# Patient Record
Sex: Female | Born: 1980 | Race: White | Hispanic: No | Marital: Married | Smoking: Former smoker
Health system: Southern US, Community
[De-identification: ages and names within clinical notes are randomized; demographics above are authoritative.]

## PROBLEM LIST (undated history)

## (undated) DIAGNOSIS — G4700 Insomnia, unspecified: Secondary | ICD-10-CM

## (undated) DIAGNOSIS — R799 Abnormal finding of blood chemistry, unspecified: Secondary | ICD-10-CM

## (undated) DIAGNOSIS — L309 Dermatitis, unspecified: Secondary | ICD-10-CM

## (undated) DIAGNOSIS — Z148 Genetic carrier of other disease: Secondary | ICD-10-CM

## (undated) DIAGNOSIS — T8859XA Other complications of anesthesia, initial encounter: Secondary | ICD-10-CM

## (undated) DIAGNOSIS — F319 Bipolar disorder, unspecified: Secondary | ICD-10-CM

## (undated) DIAGNOSIS — R011 Cardiac murmur, unspecified: Secondary | ICD-10-CM

## (undated) DIAGNOSIS — K589 Irritable bowel syndrome without diarrhea: Secondary | ICD-10-CM

## (undated) DIAGNOSIS — G43909 Migraine, unspecified, not intractable, without status migrainosus: Secondary | ICD-10-CM

## (undated) DIAGNOSIS — M797 Fibromyalgia: Secondary | ICD-10-CM

## (undated) DIAGNOSIS — T4145XA Adverse effect of unspecified anesthetic, initial encounter: Secondary | ICD-10-CM

## (undated) DIAGNOSIS — S92909K Unspecified fracture of unspecified foot, subsequent encounter for fracture with nonunion: Secondary | ICD-10-CM

## (undated) HISTORY — PX: LAPAROSCOPIC ENDOMETRIOSIS FULGURATION: SUR769

## (undated) HISTORY — PX: OOPHORECTOMY: SHX86

## (undated) HISTORY — PX: RECTOCELE REPAIR: SHX761

## (undated) HISTORY — PX: BLADDER SUSPENSION: SHX72

## (undated) HISTORY — PX: CYSTOCELE REPAIR: SHX163

## (undated) HISTORY — PX: BILATERAL SALPINGECTOMY: SHX5743

## (undated) HISTORY — PX: ABDOMINAL HYSTERECTOMY: SHX81

## (undated) HISTORY — PX: CHOLECYSTECTOMY: SHX55

## (undated) HISTORY — PX: COLONOSCOPY: SHX174

---

## 1999-01-12 ENCOUNTER — Other Ambulatory Visit: Admission: RE | Admit: 1999-01-12 | Discharge: 1999-01-12 | Payer: Self-pay | Admitting: Obstetrics and Gynecology

## 1999-11-19 ENCOUNTER — Emergency Department (HOSPITAL_COMMUNITY): Admission: EM | Admit: 1999-11-19 | Discharge: 1999-11-19 | Payer: Self-pay | Admitting: Emergency Medicine

## 2000-06-01 ENCOUNTER — Ambulatory Visit (HOSPITAL_COMMUNITY): Admission: RE | Admit: 2000-06-01 | Discharge: 2000-06-01 | Payer: Self-pay | Admitting: Obstetrics and Gynecology

## 2001-04-02 ENCOUNTER — Other Ambulatory Visit: Admission: RE | Admit: 2001-04-02 | Discharge: 2001-04-02 | Payer: Self-pay | Admitting: Obstetrics and Gynecology

## 2001-06-05 ENCOUNTER — Ambulatory Visit (HOSPITAL_COMMUNITY): Admission: RE | Admit: 2001-06-05 | Discharge: 2001-06-05 | Payer: Self-pay | Admitting: Obstetrics and Gynecology

## 2002-05-20 ENCOUNTER — Inpatient Hospital Stay (HOSPITAL_COMMUNITY): Admission: AD | Admit: 2002-05-20 | Discharge: 2002-05-20 | Payer: Self-pay | Admitting: Obstetrics and Gynecology

## 2002-05-21 ENCOUNTER — Encounter: Payer: Self-pay | Admitting: Obstetrics and Gynecology

## 2002-06-09 ENCOUNTER — Ambulatory Visit (HOSPITAL_COMMUNITY): Admission: RE | Admit: 2002-06-09 | Discharge: 2002-06-09 | Payer: Self-pay | Admitting: Obstetrics and Gynecology

## 2002-07-29 ENCOUNTER — Other Ambulatory Visit: Admission: RE | Admit: 2002-07-29 | Discharge: 2002-07-29 | Payer: Self-pay | Admitting: Obstetrics and Gynecology

## 2015-11-30 ENCOUNTER — Other Ambulatory Visit: Payer: Self-pay | Admitting: Sports Medicine

## 2015-11-30 DIAGNOSIS — M25571 Pain in right ankle and joints of right foot: Secondary | ICD-10-CM

## 2015-12-15 ENCOUNTER — Ambulatory Visit
Admission: RE | Admit: 2015-12-15 | Discharge: 2015-12-15 | Disposition: A | Discharge status: Home / Self Care | Payer: Self-pay | Source: Ambulatory Visit | Attending: Sports Medicine | Admitting: Sports Medicine

## 2015-12-15 DIAGNOSIS — M25571 Pain in right ankle and joints of right foot: Secondary | ICD-10-CM

## 2015-12-23 ENCOUNTER — Other Ambulatory Visit: Payer: Self-pay | Admitting: Orthopedic Surgery

## 2015-12-28 DIAGNOSIS — S92909K Unspecified fracture of unspecified foot, subsequent encounter for fracture with nonunion: Secondary | ICD-10-CM

## 2015-12-28 HISTORY — DX: Unspecified fracture of unspecified foot, subsequent encounter for fracture with nonunion: S92.909K

## 2015-12-30 ENCOUNTER — Encounter (HOSPITAL_BASED_OUTPATIENT_CLINIC_OR_DEPARTMENT_OTHER): Payer: Self-pay | Admitting: *Deleted

## 2016-01-05 ENCOUNTER — Ambulatory Visit (HOSPITAL_BASED_OUTPATIENT_CLINIC_OR_DEPARTMENT_OTHER): Payer: No Typology Code available for payment source | Admitting: Anesthesiology

## 2016-01-05 ENCOUNTER — Encounter (HOSPITAL_BASED_OUTPATIENT_CLINIC_OR_DEPARTMENT_OTHER): Payer: Self-pay | Admitting: Certified Registered"

## 2016-01-05 ENCOUNTER — Ambulatory Visit (HOSPITAL_BASED_OUTPATIENT_CLINIC_OR_DEPARTMENT_OTHER)
Admission: RE | Admit: 2016-01-05 | Discharge: 2016-01-05 | Disposition: A | Discharge status: Home / Self Care | Payer: No Typology Code available for payment source | Source: Ambulatory Visit | Attending: Orthopedic Surgery | Admitting: Orthopedic Surgery

## 2016-01-05 ENCOUNTER — Encounter (HOSPITAL_BASED_OUTPATIENT_CLINIC_OR_DEPARTMENT_OTHER)
Admission: RE | Disposition: A | Discharge status: Home / Self Care | Payer: Self-pay | Source: Ambulatory Visit | Attending: Orthopedic Surgery

## 2016-01-05 DIAGNOSIS — E669 Obesity, unspecified: Secondary | ICD-10-CM | POA: Diagnosis not present

## 2016-01-05 DIAGNOSIS — F319 Bipolar disorder, unspecified: Secondary | ICD-10-CM | POA: Insufficient documentation

## 2016-01-05 DIAGNOSIS — Z9049 Acquired absence of other specified parts of digestive tract: Secondary | ICD-10-CM | POA: Insufficient documentation

## 2016-01-05 DIAGNOSIS — Z79899 Other long term (current) drug therapy: Secondary | ICD-10-CM | POA: Insufficient documentation

## 2016-01-05 DIAGNOSIS — X58XXXA Exposure to other specified factors, initial encounter: Secondary | ICD-10-CM | POA: Diagnosis not present

## 2016-01-05 DIAGNOSIS — Z6834 Body mass index (BMI) 34.0-34.9, adult: Secondary | ICD-10-CM | POA: Diagnosis not present

## 2016-01-05 DIAGNOSIS — K589 Irritable bowel syndrome without diarrhea: Secondary | ICD-10-CM | POA: Insufficient documentation

## 2016-01-05 DIAGNOSIS — S92351A Displaced fracture of fifth metatarsal bone, right foot, initial encounter for closed fracture: Secondary | ICD-10-CM | POA: Insufficient documentation

## 2016-01-05 HISTORY — DX: Dermatitis, unspecified: L30.9

## 2016-01-05 HISTORY — DX: Unspecified fracture of unspecified foot, subsequent encounter for fracture with nonunion: S92.909K

## 2016-01-05 HISTORY — DX: Cardiac murmur, unspecified: R01.1

## 2016-01-05 HISTORY — DX: Other complications of anesthesia, initial encounter: T88.59XA

## 2016-01-05 HISTORY — DX: Genetic carrier of other disease: Z14.8

## 2016-01-05 HISTORY — DX: Migraine, unspecified, not intractable, without status migrainosus: G43.909

## 2016-01-05 HISTORY — PX: TENDON REPAIR: SHX5111

## 2016-01-05 HISTORY — DX: Irritable bowel syndrome, unspecified: K58.9

## 2016-01-05 HISTORY — DX: Insomnia, unspecified: G47.00

## 2016-01-05 HISTORY — DX: Abnormal finding of blood chemistry, unspecified: R79.9

## 2016-01-05 HISTORY — DX: Bipolar disorder, unspecified: F31.9

## 2016-01-05 HISTORY — DX: Fibromyalgia: M79.7

## 2016-01-05 HISTORY — DX: Adverse effect of unspecified anesthetic, initial encounter: T41.45XA

## 2016-01-05 SURGERY — TENDON REPAIR
Anesthesia: Regional | Site: Foot | Laterality: Right

## 2016-01-05 MED ORDER — PROPOFOL 10 MG/ML IV BOLUS
INTRAVENOUS | Status: AC
  Filled 2016-01-05: qty 20

## 2016-01-05 MED ORDER — LIDOCAINE 2% (20 MG/ML) 5 ML SYRINGE
INTRAMUSCULAR | Status: AC
  Filled 2016-01-05: qty 5

## 2016-01-05 MED ORDER — SENNA 8.6 MG PO TABS: 2.0000 | ORAL_TABLET | Freq: Two times a day (BID) | ORAL | 0 refills | Status: DC

## 2016-01-05 MED ORDER — 0.9 % SODIUM CHLORIDE (POUR BTL) OPTIME
TOPICAL | Status: DC | PRN
  Administered 2016-01-05: 200 mL

## 2016-01-05 MED ORDER — KETOROLAC TROMETHAMINE 30 MG/ML IJ SOLN: 30.0000 mg | Freq: Once | INTRAMUSCULAR | Status: DC | PRN

## 2016-01-05 MED ORDER — MIDAZOLAM HCL 2 MG/2ML IJ SOLN
1.0000 mg | INTRAMUSCULAR | Status: DC | PRN
  Administered 2016-01-05: 2 mg via INTRAVENOUS
  Administered 2016-01-05: 1 mg via INTRAVENOUS

## 2016-01-05 MED ORDER — DEXAMETHASONE SODIUM PHOSPHATE 10 MG/ML IJ SOLN
INTRAMUSCULAR | Status: AC
  Filled 2016-01-05: qty 1

## 2016-01-05 MED ORDER — ONDANSETRON HCL 4 MG/2ML IJ SOLN
INTRAMUSCULAR | Status: DC | PRN
  Administered 2016-01-05: 4 mg via INTRAVENOUS

## 2016-01-05 MED ORDER — OXYCODONE HCL 5 MG PO TABS: 5.0000 mg | ORAL_TABLET | ORAL | 0 refills | Status: DC | PRN

## 2016-01-05 MED ORDER — FENTANYL CITRATE (PF) 100 MCG/2ML IJ SOLN
25.0000 ug | INTRAMUSCULAR | Status: DC | PRN
  Administered 2016-01-05 (×3): 50 ug via INTRAVENOUS

## 2016-01-05 MED ORDER — SODIUM CHLORIDE 0.9 % IV SOLN: INTRAVENOUS | Status: DC

## 2016-01-05 MED ORDER — FENTANYL CITRATE (PF) 100 MCG/2ML IJ SOLN
INTRAMUSCULAR | Status: AC
  Filled 2016-01-05: qty 2

## 2016-01-05 MED ORDER — CHLORHEXIDINE GLUCONATE 4 % EX LIQD: 60.0000 mL | Freq: Once | CUTANEOUS | Status: DC

## 2016-01-05 MED ORDER — MEPERIDINE HCL 25 MG/ML IJ SOLN: 6.2500 mg | INTRAMUSCULAR | Status: DC | PRN

## 2016-01-05 MED ORDER — OXYCODONE HCL 5 MG PO TABS
5.0000 mg | ORAL_TABLET | Freq: Once | ORAL | Status: AC
  Administered 2016-01-05: 5 mg via ORAL

## 2016-01-05 MED ORDER — FENTANYL CITRATE (PF) 100 MCG/2ML IJ SOLN
50.0000 ug | INTRAMUSCULAR | Status: DC | PRN
  Administered 2016-01-05: 100 ug via INTRAVENOUS

## 2016-01-05 MED ORDER — MIDAZOLAM HCL 2 MG/2ML IJ SOLN
INTRAMUSCULAR | Status: AC
  Filled 2016-01-05: qty 2

## 2016-01-05 MED ORDER — CEFAZOLIN SODIUM-DEXTROSE 2-4 GM/100ML-% IV SOLN
2.0000 g | INTRAVENOUS | Status: AC
  Administered 2016-01-05: 2 g via INTRAVENOUS

## 2016-01-05 MED ORDER — SCOPOLAMINE 1 MG/3DAYS TD PT72: 1.0000 | MEDICATED_PATCH | Freq: Once | TRANSDERMAL | Status: DC | PRN

## 2016-01-05 MED ORDER — DEXAMETHASONE SODIUM PHOSPHATE 10 MG/ML IJ SOLN
INTRAMUSCULAR | Status: DC | PRN
  Administered 2016-01-05: 10 mg via INTRAVENOUS

## 2016-01-05 MED ORDER — LIDOCAINE HCL (CARDIAC) 20 MG/ML IV SOLN
INTRAVENOUS | Status: DC | PRN
  Administered 2016-01-05: 30 mg via INTRAVENOUS

## 2016-01-05 MED ORDER — ASPIRIN EC 81 MG PO TBEC: 81.0000 mg | DELAYED_RELEASE_TABLET | Freq: Two times a day (BID) | ORAL | 0 refills | Status: DC

## 2016-01-05 MED ORDER — BUPIVACAINE-EPINEPHRINE (PF) 0.5% -1:200000 IJ SOLN
INTRAMUSCULAR | Status: DC | PRN
  Administered 2016-01-05: 30 mL via PERINEURAL

## 2016-01-05 MED ORDER — PROMETHAZINE HCL 25 MG/ML IJ SOLN: 6.2500 mg | INTRAMUSCULAR | Status: DC | PRN

## 2016-01-05 MED ORDER — ONDANSETRON HCL 4 MG PO TABS: 4.0000 mg | ORAL_TABLET | Freq: Every day | ORAL | 1 refills | Status: DC | PRN

## 2016-01-05 MED ORDER — LACTATED RINGERS IV SOLN
INTRAVENOUS | Status: DC
  Administered 2016-01-05: 10 mL/h via INTRAVENOUS

## 2016-01-05 MED ORDER — OXYCODONE HCL 5 MG PO TABS
ORAL_TABLET | ORAL | Status: AC
  Filled 2016-01-05: qty 1

## 2016-01-05 MED ORDER — PROPOFOL 10 MG/ML IV BOLUS
INTRAVENOUS | Status: DC | PRN
  Administered 2016-01-05: 200 mg via INTRAVENOUS

## 2016-01-05 MED ORDER — ONDANSETRON HCL 4 MG/2ML IJ SOLN
INTRAMUSCULAR | Status: AC
  Filled 2016-01-05: qty 2

## 2016-01-05 SURGICAL SUPPLY — 72 items
ANCHOR SUT 1.45 SZ 1 SHORT (Anchor) ×3 IMPLANT
BANDAGE ESMARK 6X9 LF (GAUZE/BANDAGES/DRESSINGS) ×2 IMPLANT
BLADE AVERAGE 25X9 (BLADE) IMPLANT
BLADE SURG 15 STRL LF DISP TIS (BLADE) ×4 IMPLANT
BLADE SURG 15 STRL SS (BLADE) ×2
BNDG COHESIVE 4X5 TAN STRL (GAUZE/BANDAGES/DRESSINGS) ×3 IMPLANT
BNDG COHESIVE 6X5 TAN STRL LF (GAUZE/BANDAGES/DRESSINGS) ×3 IMPLANT
BNDG ESMARK 4X9 LF (GAUZE/BANDAGES/DRESSINGS) IMPLANT
BNDG ESMARK 6X9 LF (GAUZE/BANDAGES/DRESSINGS) ×3
CHLORAPREP W/TINT 26ML (MISCELLANEOUS) ×3 IMPLANT
COVER BACK TABLE 60X90IN (DRAPES) ×3 IMPLANT
CUFF TOURNIQUET SINGLE 34IN LL (TOURNIQUET CUFF) ×3 IMPLANT
DECANTER SPIKE VIAL GLASS SM (MISCELLANEOUS) IMPLANT
DRAPE EXTREMITY T 121X128X90 (DRAPE) ×3 IMPLANT
DRAPE OEC MINIVIEW 54X84 (DRAPES) ×3 IMPLANT
DRAPE U-SHAPE 47X51 STRL (DRAPES) ×3 IMPLANT
DRSG MEPITEL 4X7.2 (GAUZE/BANDAGES/DRESSINGS) ×3 IMPLANT
DRSG PAD ABDOMINAL 8X10 ST (GAUZE/BANDAGES/DRESSINGS) ×6 IMPLANT
ELECT REM PT RETURN 9FT ADLT (ELECTROSURGICAL) ×3
ELECTRODE REM PT RTRN 9FT ADLT (ELECTROSURGICAL) ×2 IMPLANT
GAUZE SPONGE 4X4 12PLY STRL (GAUZE/BANDAGES/DRESSINGS) ×3 IMPLANT
GLOVE BIO SURGEON STRL SZ8 (GLOVE) ×3 IMPLANT
GLOVE BIOGEL PI IND STRL 7.0 (GLOVE) ×4 IMPLANT
GLOVE BIOGEL PI IND STRL 8 (GLOVE) ×4 IMPLANT
GLOVE BIOGEL PI INDICATOR 7.0 (GLOVE) ×2
GLOVE BIOGEL PI INDICATOR 8 (GLOVE) ×2
GLOVE ECLIPSE 6.5 STRL STRAW (GLOVE) ×3 IMPLANT
GLOVE ECLIPSE 7.5 STRL STRAW (GLOVE) ×3 IMPLANT
GLOVE EXAM NITRILE MD LF STRL (GLOVE) IMPLANT
GOWN STRL REUS W/ TWL LRG LVL3 (GOWN DISPOSABLE) ×2 IMPLANT
GOWN STRL REUS W/ TWL XL LVL3 (GOWN DISPOSABLE) ×4 IMPLANT
GOWN STRL REUS W/TWL LRG LVL3 (GOWN DISPOSABLE) ×1
GOWN STRL REUS W/TWL XL LVL3 (GOWN DISPOSABLE) ×2
K-WIRE .062X4 (WIRE) IMPLANT
NDL SUT 6 .5 CRC .975X.05 MAYO (NEEDLE) IMPLANT
NEEDLE HYPO 22GX1.5 SAFETY (NEEDLE) IMPLANT
NEEDLE HYPO 25X1 1.5 SAFETY (NEEDLE) IMPLANT
NEEDLE MAYO TAPER (NEEDLE)
NS IRRIG 1000ML POUR BTL (IV SOLUTION) ×3 IMPLANT
PACK BASIN DAY SURGERY FS (CUSTOM PROCEDURE TRAY) ×3 IMPLANT
PAD CAST 4YDX4 CTTN HI CHSV (CAST SUPPLIES) ×2 IMPLANT
PADDING CAST ABS 4INX4YD NS (CAST SUPPLIES)
PADDING CAST ABS COTTON 4X4 ST (CAST SUPPLIES) IMPLANT
PADDING CAST COTTON 4X4 STRL (CAST SUPPLIES) ×1
PADDING CAST COTTON 6X4 STRL (CAST SUPPLIES) ×3 IMPLANT
PASSER SUT SWANSON 36MM LOOP (INSTRUMENTS) IMPLANT
PENCIL BUTTON HOLSTER BLD 10FT (ELECTRODE) ×3 IMPLANT
RETRIEVER SUT HEWSON (MISCELLANEOUS) IMPLANT
SANITIZER HAND PURELL 535ML FO (MISCELLANEOUS) ×3 IMPLANT
SHEET MEDIUM DRAPE 40X70 STRL (DRAPES) ×3 IMPLANT
SLEEVE SCD COMPRESS KNEE MED (MISCELLANEOUS) ×3 IMPLANT
SPLINT FAST PLASTER 5X30 (CAST SUPPLIES) ×20
SPLINT PLASTER CAST FAST 5X30 (CAST SUPPLIES) ×40 IMPLANT
SPONGE LAP 18X18 X RAY DECT (DISPOSABLE) ×3 IMPLANT
STOCKINETTE 6  STRL (DRAPES) ×1
STOCKINETTE 6 STRL (DRAPES) ×2 IMPLANT
SUCTION FRAZIER HANDLE 10FR (MISCELLANEOUS) ×1
SUCTION TUBE FRAZIER 10FR DISP (MISCELLANEOUS) ×2 IMPLANT
SUT ETHIBOND 0 MO6 C/R (SUTURE) IMPLANT
SUT ETHIBOND 2 OS 4 DA (SUTURE) IMPLANT
SUT ETHILON 3 0 PS 1 (SUTURE) ×3 IMPLANT
SUT FIBERWIRE 2-0 18 17.9 3/8 (SUTURE)
SUT MERSILENE 2.0 SH NDLE (SUTURE) IMPLANT
SUT MNCRL AB 3-0 PS2 18 (SUTURE) ×3 IMPLANT
SUT VIC AB 0 SH 27 (SUTURE) ×3 IMPLANT
SUT VIC AB 2-0 SH 27 (SUTURE)
SUT VIC AB 2-0 SH 27XBRD (SUTURE) IMPLANT
SUTURE FIBERWR 2-0 18 17.9 3/8 (SUTURE) IMPLANT
SYR BULB 3OZ (MISCELLANEOUS) ×3 IMPLANT
TOWEL OR 17X24 6PK STRL BLUE (TOWEL DISPOSABLE) ×6 IMPLANT
TUBE CONNECTING 20X1/4 (TUBING) ×3 IMPLANT
UNDERPAD 30X30 (UNDERPADS AND DIAPERS) ×3 IMPLANT

## 2016-01-05 NOTE — H&P (Signed)
April Madden is an 35 y.o. female.   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  She has failed non op treatment and presents today for excision of the nonunion and repair of the peroneal tendon.  Past Medical History:  Diagnosis Date  . Alpha 1-antitrypsin PiMS phenotype    deficiency  . Bipolar disorder (HCC)   . Complication of anesthesia    hard to wake up after colonoscopy, per pt.  . Eczema   . Fibromyalgia   . 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 Heart murmur    "musical", per pt. - has never required any treatment  . Insomnia   . Irritable bowel syndrome (IBS)   . Migraines     Past Surgical History:  Procedure Laterality Date  . ABDOMINAL HYSTERECTOMY    . BILATERAL SALPINGECTOMY    . BLADDER SUSPENSION    . CHOLECYSTECTOMY    . COLONOSCOPY    . CYSTOCELE REPAIR    . LAPAROSCOPIC ENDOMETRIOSIS FULGURATION     x 4-5, per pt.  . OOPHORECTOMY     unilateral  . RECTOCELE REPAIR      History reviewed. No pertinent family history. Social History:  reports that she has never smoked. She has never used smokeless tobacco. She reports that she drinks alcohol. She reports that she does not use drugs.  Allergies:  Allergies  Allergen Reactions  . Protonix [Pantoprazole] Shortness Of Breath  . Stadol [Butorphanol] Anaphylaxis  . Morphine And Related Itching  . Red Dye Other (See Comments)    DIFF. SWALLOWING; SWELLING OF TONGUE  . Soap Other (See Comments)    FRAGRANT SOAPS - EXACERBATE ECZEMA  . Adhesive [Tape] Other (See Comments)    SKIN IRRITATION    Medications Prior to Admission  Medication Sig Dispense Refill  . buPROPion (WELLBUTRIN XL) 300 MG 24 hr tablet Take 300 mg by mouth daily.    . cetirizine (ZYRTEC) 10 MG tablet Take 10 mg by mouth daily.    . clonazePAM (KLONOPIN) 1 MG tablet Take 1 mg by mouth 2 (two) times daily.    Marland Kitchen. lamoTRIgine  (LAMICTAL) 100 MG tablet Take 100 mg by mouth daily.    . traMADol (ULTRAM) 50 MG tablet Take by mouth every 6 (six) hours as needed.      No results found for this or any previous visit (from the past 48 hour(s)). No results found.  ROS  No recent f/c/n/v/wt loss  Blood pressure 123/81, pulse 80, temperature 98.9 F (37.2 C), temperature source Oral, resp. rate 20, height 5' 3.5" (1.613 m), weight 88.9 kg (196 lb), SpO2 100 %. Physical Exam  wn wd woman in nad.  A and O x 4.  Mood and affect normal.  EOMI.  resp unlabored.  R foot with healthy skin.  TTP at 5th MT base.  No lymphadenopathy.  5/5 strength in PF and DF of the ankle and toes.  Sens to LT intact along the lateral foot.  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  To OR for surgical treatment.  The risks and benefits of the alternative treatment options have been discussed in detail.  The patient wishes to proceed with surgery and specifically understands risks of bleeding, infection, nerve damage, blood clots, need for additional surgery, amputation and death.   Toni ArthursHEWITT, Lisandra Mathisen, MD 01/05/2016, 10:46 AM

## 2016-01-05 NOTE — Transfer of Care (Signed)
Immediate Anesthesia Transfer of Care Note  Patient: April Madden  Procedure(s) Performed: Procedure(s): EXCISION OF RIGHT FIFTH METARSAL BONE, REPAIR OF PERONEAL TENDON (Right)  Patient Location: PACU  Anesthesia Type:GA combined with regional for post-op pain  Level of Consciousness: awake, alert , oriented and patient cooperative  Airway & Oxygen Therapy: Patient Spontanous Breathing and Patient connected to face mask oxygen  Post-op Assessment: Report given to RN and Post -op Vital signs reviewed and stable  Post vital signs: Reviewed and stable  Last Vitals:  Vitals:   01/05/16 1045 01/05/16 1050  BP:  (!) 110/94  Pulse: 80 67  Resp: 11 12  Temp:      Last Pain:  Vitals:   01/05/16 1002  TempSrc: Oral  PainSc: 4          Complications: No apparent anesthesia complications

## 2016-01-05 NOTE — Brief Op Note (Signed)
01/05/2016  11:55 AM  PATIENT:  April Madden  35 y.o. 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  Excision of right 5th MT base nonunion 2.  Repair of right peroneus brevis tendon 3.  AP and lateral xrays of the right foot  SURGEON:  Toni ArthursJohn Tristyn Pharris, MD  ASSISTANT: Alfredo MartinezJustin Ollis, PA-C  ANESTHESIA:   General, regional  EBL:  minimal   TOURNIQUET:   Total Tourniquet Time Documented: Thigh (Right) - 25 minutes Total: Thigh (Right) - 25 minutes  COMPLICATIONS:  None apparent  DISPOSITION:  Extubated, awake and stable to recovery.  DICTATION ID:

## 2016-01-05 NOTE — Anesthesia Procedure Notes (Signed)
Procedure Name: LMA Insertion Date/Time: 01/05/2016 11:01 AM Performed by: Brion Sossamon D Pre-anesthesia Checklist: Patient identified, Emergency Drugs available, Suction available and Patient being monitored Patient Re-evaluated:Patient Re-evaluated prior to inductionOxygen Delivery Method: Circle system utilized Preoxygenation: Pre-oxygenation with 100% oxygen Intubation Type: IV induction Ventilation: Mask ventilation without difficulty LMA: LMA inserted LMA Size: 4.0 Number of attempts: 1 Airway Equipment and Method: Bite block Placement Confirmation: positive ETCO2 Tube secured with: Tape Dental Injury: Teeth and Oropharynx as per pre-operative assessment

## 2016-01-05 NOTE — Anesthesia Postprocedure Evaluation (Signed)
Anesthesia Post Note  Patient: April Madden  Procedure(s) Performed: Procedure(s) (LRB): EXCISION OF RIGHT FIFTH METARSAL BONE, REPAIR OF PERONEAL TENDON (Right)  Patient location during evaluation: PACU Anesthesia Type: General and Regional Level of consciousness: sedated and patient cooperative Pain management: pain level controlled Vital Signs Assessment: post-procedure vital signs reviewed and stable Respiratory status: spontaneous breathing Cardiovascular status: stable Anesthetic complications: no    Last Vitals:  Vitals:   01/05/16 1300 01/05/16 1415  BP: 129/85 137/85  Pulse: 98 99  Resp: 17 16  Temp:  36.8 C    Last Pain:  Vitals:   01/05/16 1400  TempSrc:   PainSc: 4                  Lewie LoronJohn Jeda Pardue

## 2016-01-05 NOTE — Anesthesia Preprocedure Evaluation (Signed)
Anesthesia Evaluation  Patient identified by MRN, date of birth, ID band Patient awake    Reviewed: Allergy & Precautions, NPO status , Patient's Chart, lab work & pertinent test results  Airway Mallampati: II  TM Distance: >3 FB Neck ROM: Full    Dental no notable dental hx.    Pulmonary neg pulmonary ROS,    Pulmonary exam normal breath sounds clear to auscultation       Cardiovascular Normal cardiovascular exam+ Valvular Problems/Murmurs  Rhythm:Regular Rate:Normal     Neuro/Psych  Headaches, negative psych ROS   GI/Hepatic negative GI ROS, Neg liver ROS,   Endo/Other  negative endocrine ROS  Renal/GU negative Renal ROS     Musculoskeletal  (+) Fibromyalgia -  Abdominal (+) + obese,   Peds  Hematology negative hematology ROS (+)   Anesthesia Other Findings   Reproductive/Obstetrics negative OB ROS                             Anesthesia Physical Anesthesia Plan  ASA: II  Anesthesia Plan:    Post-op Pain Management:    Induction:   Airway Management Planned:   Additional Equipment:   Intra-op Plan:   Post-operative Plan:   Informed Consent: I have reviewed the patients History and Physical, chart, labs and discussed the procedure including the risks, benefits and alternatives for the proposed anesthesia with the patient or authorized representative who has indicated his/her understanding and acceptance.   Dental advisory given  Plan Discussed with: CRNA  Anesthesia Plan Comments:         Anesthesia Quick Evaluation

## 2016-01-05 NOTE — Discharge Instructions (Addendum)
April ArthursJohn Hewitt, MD [facility]  Please read the following information regarding your care after surgery.  Medications  You only need a prescription for the narcotic pain medicine (ex. oxycodone, Percocet, Norco).  All of the other medicines listed below are available over the counter. X acetominophen (Tylenol) 650 mg every 4-6 hours as you need for minor pain X oxycodone as prescribed for moderate to severe pain ?   Narcotic pain medicine (ex. oxycodone, Percocet, Vicodin) will cause constipation.  To prevent this problem, take the following medicines while you are taking any pain medicine. X senna (Senokot) 2 tablets twice a day  X To help prevent blood clots, take a baby aspirin (81 mg) twice a day after surgery until you are allowed to weight bear on your operative extremity.  You should also get up every hour while you are awake to move around.    Weight Bearing X Do not bear any weight on the operated leg or foot.  Cast / Splint / Dressing X Keep your splint or cast clean and dry.  Dont put anything (coat hanger, pencil, etc) down inside of it.  If it gets damp, use a hair dryer on the cool setting to dry it.  If it gets soaked, call the office to schedule an appointment for a cast change.  After your dressing, cast or splint is removed; you may shower, but do not soak or scrub the wound.  Allow the water to run over it, and then gently pat it dry.  Swelling It is normal for you to have swelling where you had surgery.  To reduce swelling and pain, keep your toes above your nose for at least 3 days after surgery.  It may be necessary to keep your foot or leg elevated for several weeks.  If it hurts, it should be elevated.  Follow Up Call my office at [phone] when you are discharged from the hospital or surgery center to schedule an appointment to be seen two weeks after surgery.  Call my office at [phone] if you develop a fever >101.5 F, nausea, vomiting,  bleeding from the surgical site or severe pain.       Regional Anesthesia Blocks  1. Numbness or the inability to move the "blocked" extremity may last from 3-48 hours after placement. The length of time depends on the medication injected and your individual response to the medication. If the numbness is not going away after 48 hours, call your surgeon.  2. The extremity that is blocked will need to be protected until the numbness is gone and the  Strength has returned. Because you cannot feel it, you will need to take extra care to avoid injury. Because it may be weak, you may have difficulty moving it or using it. You may not know what position it is in without looking at it while the block is in effect.  3. For blocks in the legs and feet, returning to weight bearing and walking needs to be done carefully. You will need to wait until the numbness is entirely gone and the strength has returned. You should be able to move your leg and foot normally before you try and bear weight or walk. You will need someone to be with you when you first try to ensure you do not fall and possibly risk injury.  4. Bruising and tenderness at the needle site are common side effects and will resolve in a few days.  5. Persistent numbness or new problems  with movement should be communicated to the surgeon or the [facility] [phone])/ [facility] [phone]).       Post Anesthesia Home Care Instructions  Activity: Get plenty of rest for the remainder of the day. A responsible adult should stay with you for 24 hours following the procedure.  For the next 24 hours, DO NOT: -Drive a car -Advertising copywriterperate machinery -Drink alcoholic beverages -Take any medication unless instructed by your physician -Make any legal decisions or sign important papers.  Meals: Start with liquid foods such as gelatin or soup. Progress to regular foods as tolerated. Avoid greasy, spicy, heavy foods. If  nausea and/or vomiting occur, drink only clear liquids until the nausea and/or vomiting subsides. Call your physician if vomiting continues.  Special Instructions/Symptoms: Your throat may feel dry or sore from the anesthesia or the breathing tube placed in your throat during surgery. If this causes discomfort, gargle with warm salt water. The discomfort should disappear within 24 hours.  If you had a scopolamine patch placed behind your ear for the management of post- operative nausea and/or vomiting:  1. The medication in the patch is effective for 72 hours, after which it should be removed.  Wrap patch in a tissue and discard in the trash. Wash hands thoroughly with soap and water. 2. You may remove the patch earlier than 72 hours if you experience unpleasant side effects which may include dry mouth, dizziness or visual disturbances. 3. Avoid touching the patch. Wash your hands with soap and water after contact with the patch.

## 2016-01-05 NOTE — Anesthesia Procedure Notes (Addendum)
Anesthesia Regional Block:  Popliteal block  Pre-Anesthetic Checklist: ,, timeout performed, Correct Patient, Correct Site, Correct Laterality, Correct Procedure, Correct Position, site marked, Risks and benefits discussed,  Surgical consent,  Pre-op evaluation,  At surgeon's request and post-op pain management  Laterality: Right  Prep: chloraprep       Needles:  Injection technique: Single-shot  Needle Type: Stimiplex     Needle Length: 10cm 10 cm Needle Gauge: 21 and 21 G    Additional Needles:  Procedures: ultrasound guided (picture in chart)  Motor weakness within 5 minutes. Popliteal block  Nerve Stimulator or Paresthesia:  Response: Plantar flexion/toe flexion, 0.5 mA,   Additional Responses:   Narrative:  Start time: 01/05/2016 10:34 AM End time: 01/05/2016 10:39 AM Injection made incrementally with aspirations every 5 mL.  Performed by: Personally  Anesthesiologist: Lewie LoronGERMEROTH, Shebra Muldrow  Additional Notes: Nerve located and needle positioned with direct ultrasound guidance. Good perineural spread. Patient tolerated well.

## 2016-01-05 NOTE — Progress Notes (Signed)
Assisted Dr. Germeroth with right, ultrasound guided, popliteal block. Side rails up, monitors on throughout procedure. See vital signs in flow sheet. Tolerated Procedure well. 

## 2016-01-06 ENCOUNTER — Encounter (HOSPITAL_BASED_OUTPATIENT_CLINIC_OR_DEPARTMENT_OTHER): Payer: Self-pay | Admitting: Orthopedic Surgery

## 2016-01-06 NOTE — Op Note (Signed)
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  PROCEDURE: 1. Excision of right fifth metatarsal base nonunion. 2. Repair of right peroneus brevis tendon. 3. AP and lateral radiographs of the right foot.  SURGEON:  Toni ArthursJohn Alexxia Stankiewicz, M.D.  ASSISTANT:  Alfredo MartinezJustin Ollis, PA-C.  ANESTHESIA:  General, regional.  ESTIMATED BLOOD LOSS:  Minimal.  TOURNIQUET TIME:  25 minutes at 250 mmHg.  COMPLICATIONS:  None apparent.  DISPOSITION:  Extubated, awake, and stable to recovery.  INDICATIONS FOR PROCEDURE:  The patient is a 35 year old woman who injured her right foot several months ago.  She had a fifth metatarsal base avulsion fracture and is continued having significant pain. Radiographs revealed nonunion of the fracture site.  She presents today for excision of the nonunited fragment and repair of the peroneus brevis tendon.  She understands the risks and benefits of the alternative treatment options and elects surgical treatment.  She specifically understands risks of bleeding, infection, nerve damage, blood clots, need for additional surgery, continued pain, amputation, and death.  PROCEDURE IN DETAIL:  After preoperative consent was obtained and the correct operative site was identified, the patient was brought to the operating room and placed supine on the operating table.  General anesthesia was induced.  Preoperative antibiotics were administered. Surgical time-out was taken.  The right lower extremity was prepped and draped in standard sterile fashion with  a tourniquet around the thigh. The extremity was exsanguinated and the tourniquet was inflated to 250 mmHg.  A longitudinal incision was made over the fifth metatarsal base. Sharp dissection was carried down through the skin and subcutaneous tissue.  The peroneus brevis tendon was identified.  It was split longitudinally and the nonunited fragment of bone was identified.  This was dissected in subperiosteal fashion from the surrounding tendon and was removed in its entirety.  The distal segment of the bone was cleaned of all fibrous tissue from the nonunion site.  The bone in this area was decorticated exposing medullary bone.  The wound was irrigated.  A Biomet 1.45 mm JuggerKnot anchor was then inserted after pre-drilling with a guidewire.  AP and lateral radiographs had been obtained with the guidewire in place showing appropriate position.  These radiographs also showed complete removal of the nonunited fragment of bone.  The guide pin was then removed.  The anchor was inserted and was noted to have excellent purchase.  The suture ends were passed through the peroneus brevis and it was repaired down to bone with horizontal mattress sutures.  0 Vicryl imbricating sutures were used to further repair the tendon to the surrounding periosteum.  Deep subcutaneous tissues were approximated with Vicryl, superficial subcutaneous tissues were closed with Monocryl, skin  incision was closed with nylon.  Sterile dressings were applied followed by well-padded short-leg splint.  Tourniquet was released after application of the dressings at 25 minutes.  The patient was awakened from anesthesia and transported to the recovery room in stable condition.  FOLLOWUP PLAN:  The patient will be nonweightbearing on the right lower extremity in a short-leg splint.  She will take aspirin for DVT prophylaxis and follow up with me in the office in 2 weeks for suture removal.  Alfredo MartinezJustin Ollis, PA-C, was present  and scrubbed for the duration of the case.  His assistance was essential in positioning the patient, prepping and draping, gaining and maintaining exposure, performing the operation, closing and dressing of the wounds, and applying the splint.  RADIOGRAPHS:  AP and lateral radiographs of the right foot showed complete excision of the nonunited fragment from the fifth metatarsal base.  Appropriate alignment of the guide pin for the suture anchor is also noted.  No other acute injuries are noted.     Toni ArthursJohn Bartlomiej Jenkinson, MD     JH/MEDQ  D:  01/05/2016  T:  01/06/2016  Job:  [id number]

## 2016-02-13 NOTE — Addendum Note (Signed)
Addendum  created 02/13/16 1042 by Lewie LoronJohn Reita Shindler, MD   Anesthesia Intra Blocks edited, Sign clinical note

## 2016-02-23 ENCOUNTER — Ambulatory Visit: Payer: No Typology Code available for payment source | Attending: Orthopedic Surgery | Admitting: Physical Therapy

## 2016-02-23 ENCOUNTER — Encounter: Payer: Self-pay | Admitting: Physical Therapy

## 2016-02-23 DIAGNOSIS — M25571 Pain in right ankle and joints of right foot: Secondary | ICD-10-CM

## 2016-02-23 DIAGNOSIS — M25671 Stiffness of right ankle, not elsewhere classified: Secondary | ICD-10-CM | POA: Diagnosis present

## 2016-02-23 DIAGNOSIS — R262 Difficulty in walking, not elsewhere classified: Secondary | ICD-10-CM | POA: Insufficient documentation

## 2016-02-23 NOTE — Therapy (Signed)
[facility] [address] [place], [place], [address] Phone: [phone]   Fax:  [phone]  Physical Therapy Evaluation  Patient Details  Name: April Madden MRN: [id number] Date of Birth: 1980-05-28 Referring Provider: Victorino DikeHewitt  Encounter Date: 02/23/2016      PT End of Session - 02/23/16 0847    Visit Number 1   Date for PT Re-Evaluation 04/25/16   PT Start Time 0800   PT Stop Time 0847   PT Time Calculation (min) 47 min   Activity Tolerance Patient tolerated treatment well   Behavior During [facility] for tasks assessed/performed      Past Medical History:  Diagnosis Date  . Alpha 1-antitrypsin PiMS phenotype    deficiency  . Bipolar disorder (HCC)   . Complication of anesthesia    hard to wake up after colonoscopy, per pt.  . Eczema   . Fibromyalgia   . 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 Heart murmur    "musical", per pt. - has never required any treatment  . Insomnia   . Irritable bowel syndrome (IBS)   . Migraines     Past Surgical History:  Procedure Laterality Date  . ABDOMINAL HYSTERECTOMY    . BILATERAL SALPINGECTOMY    . BLADDER SUSPENSION    . CHOLECYSTECTOMY    . COLONOSCOPY    . CYSTOCELE REPAIR    . LAPAROSCOPIC ENDOMETRIOSIS FULGURATION     x 4-5, per pt.  . OOPHORECTOMY     unilateral  . RECTOCELE REPAIR    . TENDON REPAIR Right 01/05/2016   Procedure: EXCISION OF RIGHT FIFTH METARSAL BONE, REPAIR OF PERONEAL TENDON;  Surgeon: Toni ArthursJohn Hewitt, MD;  Location: [facility];  Service: Orthopedics;  Laterality: Right;    There were no vitals filed for this visit.       Subjective Assessment - 02/23/16 0805    Subjective Patient reports that she fell on stairs at home in August 2nd.  She sustained an avulsion fracture and partial peroneal tendon rupture and 5th metatarsal fracture.  She was in a hard cast for 2 weeks and then in a  boot.  The fracture did not heal.  She underwent surgery on November 9th, she was non weightbearing until November 28th.  She was then placed in a boot and was able to bear weight as tolerated.     Limitations Lifting;Standing;Walking   Patient Stated Goals walk without pain   Currently in Pain? Yes   Pain Score 2    Pain Location Foot   Pain Orientation Right;Lateral   Pain Descriptors / Indicators Aching;Sore   Pain Type Surgical pain   Pain Onset More than a month ago   Pain Frequency Constant   Aggravating Factors  walking with a shoe pain up to 5/10, inversion causes pain to a 6/10   Pain Relieving Factors rest and being in the boot   Effect of Pain on Daily Activities difficulty walking            [facility] PT Assessment - 02/23/16 0001      Assessment   Medical Diagnosis s/p right foot surgery   Referring Provider Hewitt   Onset Date/Surgical Date 01/04/17   Prior Therapy none     Precautions   Precautions None     Restrictions   Weight Bearing Restrictions No     Balance Screen   Has the patient  fallen in the past 6 months Yes   How many times? 1   Has the patient had a decrease in activity level because of a fear of falling?  No   Is the patient reluctant to leave their home because of a fear of falling?  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   Leisure prior to the fall she was doing elliptical     ROM / Strength   AROM / PROM / Strength AROM;Strength     AROM   AROM Assessment Site Ankle   Right/Left Ankle Right   Right Ankle Dorsiflexion 4   Right Ankle Plantar Flexion 40   Right Ankle Inversion 25   Right Ankle Eversion 0     Strength   Overall Strength Comments in the available ROM 4-/5     Flexibility   Soft Tissue Assessment  /Muscle Length --  tight calf and HS     Palpation   Palpation comment tender around the scar     Ambulation/Gait   Gait Comments no device, wears boot most of the time, antalgic on the right, decreased toe off, small steps                           PT Education - 02/23/16 0846    Education provided Yes   Education Details Yellow and red tband ankle exercises, calf stretches, piriformis stretches   Person(s) Educated Patient   Methods Explanation;Demonstration;Handout   Comprehension Verbalized understanding;Returned demonstration          PT Short Term Goals - 02/23/16 0851      PT SHORT TERM GOAL #1   Title independent with initial HEP   Time 1   Period Days   Status Achieved                  Plan - 02/23/16 0848    Clinical Impression Statement Patient reports that she had a fall and sustained a fracture and partial peroneal tendon rupture.  She underwent surgery 01/04/2017.  She has been non weightbearing or weight bearing ina boot since August 2nd.  She has decreased DF and eversion.  Difficulty walking with decreased toe off.   Rehab Potential Good   PT Frequency 1x / week   PT Duration 8 weeks   PT Treatment/Interventions ADLs/Self Care Home Management   PT Next Visit Plan Her deductible starts over so she will more than likely not be seen, I gave her plenty of exercises and stretches to do and went over how to progress   Consulted and Agree with Plan of Care Patient      Patient will benefit from skilled therapeutic intervention in order to improve the following deficits and impairments:  Abnormal gait, Decreased balance, Decreased activity tolerance, Decreased strength, Increased edema, Pain, Impaired flexibility, Difficulty walking, Decreased range of motion  Visit Diagnosis: Pain in right ankle and joints of right foot - Plan: PT plan of care cert/re-cert  Stiffness of right ankle, not elsewhere classified - Plan: PT plan of care  cert/re-cert  Difficulty in walking, not elsewhere classified - Plan: PT plan of care cert/re-cert     Problem List There are no active  problems to display for this patient.   Jearld Lesch., PT 02/23/2016, 8:52 AM  [facility] [address] [place], [place], [address] Phone: [phone]   Fax:  [phone]  Name: SYDNY SCHNITZLER MRN: [id number] Date of Birth: Sep 12, 1980

## 2018-03-20 ENCOUNTER — Emergency Department (HOSPITAL_BASED_OUTPATIENT_CLINIC_OR_DEPARTMENT_OTHER)
Admission: EM | Admit: 2018-03-20 | Discharge: 2018-03-20 | Disposition: A | Discharge status: Home / Self Care | Payer: No Typology Code available for payment source | Attending: Emergency Medicine | Admitting: Emergency Medicine

## 2018-03-20 ENCOUNTER — Other Ambulatory Visit: Payer: Self-pay

## 2018-03-20 ENCOUNTER — Emergency Department (HOSPITAL_BASED_OUTPATIENT_CLINIC_OR_DEPARTMENT_OTHER): Payer: No Typology Code available for payment source

## 2018-03-20 ENCOUNTER — Encounter (HOSPITAL_BASED_OUTPATIENT_CLINIC_OR_DEPARTMENT_OTHER): Payer: Self-pay | Admitting: Emergency Medicine

## 2018-03-20 DIAGNOSIS — W19XXXA Unspecified fall, initial encounter: Secondary | ICD-10-CM | POA: Insufficient documentation

## 2018-03-20 DIAGNOSIS — Y929 Unspecified place or not applicable: Secondary | ICD-10-CM | POA: Insufficient documentation

## 2018-03-20 DIAGNOSIS — Z79899 Other long term (current) drug therapy: Secondary | ICD-10-CM | POA: Diagnosis not present

## 2018-03-20 DIAGNOSIS — Y999 Unspecified external cause status: Secondary | ICD-10-CM | POA: Diagnosis not present

## 2018-03-20 DIAGNOSIS — S8991XA Unspecified injury of right lower leg, initial encounter: Secondary | ICD-10-CM | POA: Diagnosis present

## 2018-03-20 DIAGNOSIS — S80211A Abrasion, right knee, initial encounter: Secondary | ICD-10-CM | POA: Diagnosis not present

## 2018-03-20 DIAGNOSIS — S8001XA Contusion of right knee, initial encounter: Secondary | ICD-10-CM | POA: Insufficient documentation

## 2018-03-20 DIAGNOSIS — T148XXA Other injury of unspecified body region, initial encounter: Secondary | ICD-10-CM

## 2018-03-20 DIAGNOSIS — Y939 Activity, unspecified: Secondary | ICD-10-CM | POA: Diagnosis not present

## 2018-03-20 MED ORDER — IBUPROFEN 800 MG PO TABS
800.0000 mg | ORAL_TABLET | Freq: Once | ORAL | Status: AC
  Administered 2018-03-20: 800 mg via ORAL
  Filled 2018-03-20: qty 1

## 2018-03-20 MED ORDER — MELOXICAM 7.5 MG PO TABS
7.5000 mg | ORAL_TABLET | Freq: Every day | ORAL | 0 refills | Status: AC
Start: 2018-03-20 — End: ?

## 2018-03-20 MED ORDER — ACETAMINOPHEN 500 MG PO TABS
1000.0000 mg | ORAL_TABLET | Freq: Once | ORAL | Status: AC
Start: 2018-03-20 — End: 2018-03-20
  Administered 2018-03-20: 1000 mg via ORAL
  Filled 2018-03-20: qty 2

## 2018-03-20 NOTE — ED Provider Notes (Signed)
[facility] EMERGENCY DEPARTMENT Provider Note   CSN: [id number] Arrival date & time: 03/20/18  0416     History   Chief Complaint Chief Complaint  Patient presents with  . Knee Pain    HPI April Madden is a 38 y.o. female.  The history is provided by the patient.  Knee Pain  Location:  Knee Injury: yes   Mechanism of injury: fall   Fall:    Fall occurred:  Standing   Impact surface:  Hard floor   Point of impact:  Knees   Entrapped after fall: no   Knee location:  R knee Pain details:    Quality:  Aching   Radiates to:  Does not radiate   Severity:  Moderate   Onset quality:  Sudden   Timing:  Constant   Progression:  Unchanged Chronicity:  New Dislocation: no   Foreign body present:  No foreign bodies Tetanus status:  Up to date Prior injury to area:  Yes (tripped and hit same knee a week ago and has bruise and abrasion) Relieved by:  Nothing Worsened by:  Nothing Ineffective treatments:  Ice Associated symptoms: no back pain, no muscle weakness, no neck pain, no numbness, no stiffness and no swelling   Risk factors: no concern for non-accidental trauma   pushes and landed on right knee, already had an abrasion and bruise from a previous fall on that knee.    Past Medical History:  Diagnosis Date  . Alpha 1-antitrypsin PiMS phenotype    deficiency  . Bipolar disorder (HCC)   . Complication of anesthesia    hard to wake up after colonoscopy, per pt.  . Eczema   . Fibromyalgia   . 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 Heart murmur    "musical", per pt. - has never required any treatment  . Insomnia   . Irritable bowel syndrome (IBS)   . Migraines     There are no active problems to display for this patient.   Past Surgical History:  Procedure Laterality Date  . ABDOMINAL HYSTERECTOMY    . BILATERAL SALPINGECTOMY    . BLADDER SUSPENSION    . CHOLECYSTECTOMY    . COLONOSCOPY    . CYSTOCELE REPAIR      . LAPAROSCOPIC ENDOMETRIOSIS FULGURATION     x 4-5, per pt.  . OOPHORECTOMY     unilateral  . RECTOCELE REPAIR    . TENDON REPAIR Right 01/05/2016   Procedure: EXCISION OF RIGHT FIFTH METARSAL BONE, REPAIR OF PERONEAL TENDON;  Surgeon: Toni Arthurs, MD;  Location: [facility];  Service: Orthopedics;  Laterality: Right;     OB History   No obstetric history on file.      Home Medications    Prior to Admission medications   Medication Sig Start Date End Date Taking? Authorizing Provider  buPROPion (WELLBUTRIN XL) 300 MG 24 hr tablet Take 300 mg by mouth daily.   Yes [provider]  busPIRone (BUSPAR) 10 MG tablet Take 10 mg by mouth 2 (two) times daily.   Yes [provider]  gabapentin (NEURONTIN) 300 MG capsule Take 300 mg by mouth 3 (three) times daily.   Yes [provider]    Family History No family history on file.  Social History Social History   Tobacco Use  . Smoking status: Never Smoker  . Smokeless tobacco: Never Used  Substance Use Topics  . Alcohol use:  Yes    Comment: occasionally  . Drug use: No     Allergies   Protonix [pantoprazole]; Stadol [butorphanol]; Morphine and related; Red dye; Soap; and Adhesive [tape]   Review of Systems Review of Systems  Respiratory: Negative for shortness of breath.   Cardiovascular: Negative for chest pain.  Musculoskeletal: Positive for arthralgias. Negative for back pain, neck pain and stiffness.  Skin: Negative for wound.  All other systems reviewed and are negative.    Physical Exam Updated Vital Signs BP 118/78 (BP Location: Left Arm)   Pulse 70   Temp 98.2 F (36.8 C) (Oral)   Resp 18   Ht 5\' 4"  (1.626 m)   Wt 65.8 kg   SpO2 100%   BMI 24.89 kg/m   Physical Exam Constitutional:      Appearance: Normal appearance.  HENT:     Head: Normocephalic and atraumatic.     Nose: Nose normal.  Eyes:     Extraocular Movements: Extraocular movements intact.   Neck:     Musculoskeletal: Normal range of motion and neck supple.  Cardiovascular:     Rate and Rhythm: Normal rate and regular rhythm.     Pulses: Normal pulses.     Heart sounds: Normal heart sounds.  Pulmonary:     Effort: Pulmonary effort is normal.     Breath sounds: Normal breath sounds.  Abdominal:     General: Abdomen is flat. Bowel sounds are normal.     Tenderness: There is no abdominal tenderness.  Skin:    General: Skin is warm and dry.     Capillary Refill: Capillary refill takes less than 2 seconds.  Neurological:     Mental Status: She is alert and oriented to person, place, and time.  Psychiatric:        Mood and Affect: Mood normal.        Behavior: Behavior normal.      ED Treatments / Results  Labs (all labs ordered are listed, but only abnormal results are displayed) Labs Reviewed  PREGNANCY, URINE    EKG None  Radiology No results found.  Procedures Procedures (including critical care time)  Medications Ordered in ED Medications - No data to display  Wound care for abrasion and ace wrap applied.     Final Clinical Impressions(s) / ED Diagnoses   Return for pain, intractable cough, productive cough,fevers >100.4 unrelieved by medication, shortness of breath, intractable vomiting, or diarrhea, abdominal pain, Inability to tolerate liquids or food, cough, altered mental status or any concerns. No signs of systemic illness or infection. The patient is nontoxic-appearing on exam and vital signs are within normal limits.   I have reviewed the triage vital signs and the nursing notes. Pertinent labs &imaging results that were available during my care of the patient were reviewed by me and considered in my medical decision making (see chart for details).  After history, exam, and medical workup I feel the patient has been appropriately medically screened and is safe for discharge home. Pertinent diagnoses were discussed with the patient.  Patient was given return precautions.      Kensly Bowmer, MD 03/20/18 [phone]
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
## 2018-03-28 ENCOUNTER — Other Ambulatory Visit: Payer: Self-pay

## 2018-03-28 ENCOUNTER — Emergency Department (HOSPITAL_BASED_OUTPATIENT_CLINIC_OR_DEPARTMENT_OTHER)
Admission: EM | Admit: 2018-03-28 | Discharge: 2018-03-28 | Disposition: A | Discharge status: Home / Self Care | Payer: BC Managed Care – PPO | Attending: Emergency Medicine | Admitting: Emergency Medicine

## 2018-03-28 ENCOUNTER — Encounter (HOSPITAL_BASED_OUTPATIENT_CLINIC_OR_DEPARTMENT_OTHER): Payer: Self-pay

## 2018-03-28 ENCOUNTER — Emergency Department (HOSPITAL_BASED_OUTPATIENT_CLINIC_OR_DEPARTMENT_OTHER): Payer: BC Managed Care – PPO

## 2018-03-28 DIAGNOSIS — S060X0A Concussion without loss of consciousness, initial encounter: Secondary | ICD-10-CM | POA: Insufficient documentation

## 2018-03-28 DIAGNOSIS — Y92219 Unspecified school as the place of occurrence of the external cause: Secondary | ICD-10-CM | POA: Insufficient documentation

## 2018-03-28 DIAGNOSIS — Y998 Other external cause status: Secondary | ICD-10-CM | POA: Diagnosis not present

## 2018-03-28 DIAGNOSIS — S0083XA Contusion of other part of head, initial encounter: Secondary | ICD-10-CM

## 2018-03-28 DIAGNOSIS — Y9389 Activity, other specified: Secondary | ICD-10-CM | POA: Insufficient documentation

## 2018-03-28 DIAGNOSIS — R112 Nausea with vomiting, unspecified: Secondary | ICD-10-CM | POA: Insufficient documentation

## 2018-03-28 DIAGNOSIS — S0990XA Unspecified injury of head, initial encounter: Secondary | ICD-10-CM | POA: Diagnosis present

## 2018-03-28 MED ORDER — OXYCODONE-ACETAMINOPHEN 5-325 MG PO TABS
1.0000 | ORAL_TABLET | Freq: Once | ORAL | Status: AC
  Administered 2018-03-28: 1 via ORAL
  Filled 2018-03-28: qty 1

## 2018-03-28 MED ORDER — ONDANSETRON 4 MG PO TBDP
4.0000 mg | ORAL_TABLET | Freq: Once | ORAL | Status: AC
  Administered 2018-03-28: 4 mg via ORAL
  Filled 2018-03-28: qty 1

## 2018-03-28 MED ORDER — ONDANSETRON 4 MG PO TBDP: 4.0000 mg | ORAL_TABLET | Freq: Three times a day (TID) | ORAL | 0 refills | Status: DC | PRN

## 2018-03-28 MED ORDER — BUTALBITAL-APAP-CAFFEINE 50-325-40 MG PO TABS: 1.0000 | ORAL_TABLET | Freq: Four times a day (QID) | ORAL | 0 refills | Status: DC | PRN

## 2018-03-28 MED ORDER — ONDANSETRON 4 MG PO TBDP: 4.0000 mg | ORAL_TABLET | Freq: Three times a day (TID) | ORAL | 0 refills | Status: AC | PRN

## 2018-03-28 MED ORDER — DIMENHYDRINATE 50 MG PO TABS: 50.0000 mg | ORAL_TABLET | Freq: Three times a day (TID) | ORAL | 0 refills | Status: AC | PRN
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
## 2018-03-28 NOTE — ED Notes (Signed)
Pt enrolled in aromatherapy pain trial 

## 2019-04-25 ENCOUNTER — Ambulatory Visit: Payer: BC Managed Care – PPO | Attending: Internal Medicine

## 2019-04-25 DIAGNOSIS — Z23 Encounter for immunization: Secondary | ICD-10-CM

## 2019-04-25 NOTE — Progress Notes (Signed)
   Covid-19 Vaccination Clinic  Name:  April Madden    MRN: [id number] DOB: [birth year]  04/25/2019  [name] was observed post Covid-19 immunization for 15 minutes without incidence. She was provided with Vaccine Information Sheet and instruction to access the V-Safe system.   [name] was instructed to call 911 with any severe reactions post vaccine: Marland Kitchen Difficulty breathing  . Swelling of your face and throat  . A fast heartbeat  . A bad rash all over your body  . Dizziness and weakness    Immunizations Administered    Name Date Dose VIS Date Route   Pfizer COVID-19 Vaccine 04/25/2019  1:46 PM 0.3 mL 02/06/2019 Intramuscular   Manufacturer: [employer], [employer]   Lot: [id number]   NDC: [address]-7     2

## 2019-05-16 ENCOUNTER — Ambulatory Visit: Payer: BC Managed Care – PPO | Attending: Internal Medicine

## 2019-05-16 DIAGNOSIS — Z23 Encounter for immunization: Secondary | ICD-10-CM

## 2019-05-16 NOTE — Progress Notes (Signed)
   Covid-19 Vaccination Clinic  Name:  April Madden    MRN: [id number] DOB: Jul 01, 1980  05/16/2019  April Madden was observed post Covid-19 immunization for 30 minutes based on pre-vaccination screening without incident. She was provided with Vaccine Information Sheet and instruction to access the V-Safe system.   April Madden was instructed to call 911 with any severe reactions post vaccine: Marland Kitchen Difficulty breathing  . Swelling of face and throat  . A fast heartbeat  . A bad rash all over body  . Dizziness and weakness   Immunizations Administered    Name Date Dose VIS Date Route   Pfizer COVID-19 Vaccine 05/16/2019  8:13 AM 0.3 mL 02/06/2019 Intramuscular   Manufacturer: [employer], [employer]   Lot: [id number]   NDC: [address]-4
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
## 2019-11-11 ENCOUNTER — Other Ambulatory Visit: Payer: Self-pay

## 2019-11-11 ENCOUNTER — Encounter (HOSPITAL_BASED_OUTPATIENT_CLINIC_OR_DEPARTMENT_OTHER): Payer: Self-pay

## 2019-11-11 ENCOUNTER — Emergency Department (HOSPITAL_BASED_OUTPATIENT_CLINIC_OR_DEPARTMENT_OTHER): Payer: BC Managed Care – PPO

## 2019-11-11 ENCOUNTER — Emergency Department (HOSPITAL_BASED_OUTPATIENT_CLINIC_OR_DEPARTMENT_OTHER)
Admission: EM | Admit: 2019-11-11 | Discharge: 2019-11-11 | Disposition: A | Discharge status: Home / Self Care | Payer: BC Managed Care – PPO | Attending: Emergency Medicine | Admitting: Emergency Medicine

## 2019-11-11 DIAGNOSIS — Z87891 Personal history of nicotine dependence: Secondary | ICD-10-CM | POA: Insufficient documentation

## 2019-11-11 DIAGNOSIS — Z79899 Other long term (current) drug therapy: Secondary | ICD-10-CM | POA: Diagnosis not present

## 2019-11-11 DIAGNOSIS — R05 Cough: Secondary | ICD-10-CM | POA: Diagnosis present

## 2019-11-11 DIAGNOSIS — J209 Acute bronchitis, unspecified: Secondary | ICD-10-CM | POA: Insufficient documentation

## 2019-11-11 DIAGNOSIS — Z20822 Contact with and (suspected) exposure to covid-19: Secondary | ICD-10-CM | POA: Insufficient documentation

## 2019-11-11 LAB — SARS CORONAVIRUS 2 BY RT PCR (HOSPITAL ORDER, PERFORMED IN ~~LOC~~ HOSPITAL LAB): SARS Coronavirus 2: NEGATIVE

## 2019-11-11 MED ORDER — ALBUTEROL SULFATE HFA 108 (90 BASE) MCG/ACT IN AERS: 1.0000 | INHALATION_SPRAY | Freq: Four times a day (QID) | RESPIRATORY_TRACT | 1 refills | Status: AC | PRN

## 2019-11-11 MED ORDER — IPRATROPIUM-ALBUTEROL 0.5-2.5 (3) MG/3ML IN SOLN
3.0000 mL | Freq: Once | RESPIRATORY_TRACT | Status: AC
  Administered 2019-11-11: 3 mL via RESPIRATORY_TRACT
  Filled 2019-11-11: qty 3

## 2019-11-11 MED FILL — ALBUTEROL SULFATE HFA 108 (: 108 (90 BAS | 25 days supply | Qty: 18 | Fill #0

## 2019-11-11 NOTE — Discharge Instructions (Addendum)
Your work-up today was reassuring.  Your Covid test was negative.  You most likely have acute bronchitis, as we discussed.  Please continue to take your medications including your albuterol, steroids and Mucinex.  If you continue to have worsening symptoms please come back to the emergency department.  I also referred you to a pulmonologist for your antitrypsin 1 as we spoke about.  They should call you within the next week.  I hope you feel better!

## 2019-11-11 NOTE — ED Triage Notes (Addendum)
Pt states she was dx with bronchitis at [facility] on 9/11-pt states she had neg covid-no CXR-states she feels worse-NAD-to triage in w/c-pt requested covid test-done

## 2019-11-11 NOTE — ED Provider Notes (Signed)
[facility] EMERGENCY DEPARTMENT Provider Note   CSN: [id number] Arrival date & time: 11/11/19  1226     History Chief Complaint  Patient presents with  . Cough    April Madden is a 39 y.o. female with alpha-1 antitrypsin, eczema, fibromyalgia, IBS that presents the emergency department today for cough since 9/11.  States that she was seen at urgent care that day and was diagnosed with bronchitis, was prescribed albuterol, steroids, Robitussin, allergy medications at that time.  States that since then her symptoms are becoming worse and she was worried about pneumonia and Covid therefore she came here.  States that her coughing attacks are worse at night, finds it difficult to breathe when she is having these.  Not currently having any shortness of breath or chest pain.  No myalgias, fevers, chills, nausea, vomiting, sore throat, congestion, eye pain, ear pain, facial pain.  Patient has been vaccinated against Covid.  No sick contacts.  States that she was generally healthy before this.  Patient states that she generally gets bronchitis yearly due to her alpha-1 antitrypsin.  States that she is compliant with her medications.  Nothing makes this worse or better.  States that she is only had about 4 days of symptoms.   HPI     Past Medical History:  Diagnosis Date  . Alpha 1-antitrypsin PiMS phenotype    deficiency  . Bipolar disorder (HCC)   . Complication of anesthesia    hard to wake up after colonoscopy, per pt.  . Eczema   . Fibromyalgia   . 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 Heart murmur    "musical", per pt. - has never required any treatment  . Insomnia   . Irritable bowel syndrome (IBS)   . Migraines     There are no problems to display for this patient.   Past Surgical History:  Procedure Laterality Date  . ABDOMINAL HYSTERECTOMY    . BILATERAL SALPINGECTOMY    . BLADDER SUSPENSION    . CHOLECYSTECTOMY    .  COLONOSCOPY    . CYSTOCELE REPAIR    . LAPAROSCOPIC ENDOMETRIOSIS FULGURATION     x 4-5, per pt.  . OOPHORECTOMY     unilateral  . RECTOCELE REPAIR    . TENDON REPAIR Right 01/05/2016   Procedure: EXCISION OF RIGHT FIFTH METARSAL BONE, REPAIR OF PERONEAL TENDON;  Surgeon: Toni Arthurs, MD;  Location: [facility];  Service: Orthopedics;  Laterality: Right;     OB History   No obstetric history on file.     No family history on file.  Social History   Tobacco Use  . Smoking status: Former Games developer  . Smokeless tobacco: Never Used  Vaping Use  . Vaping Use: Never used  Substance Use Topics  . Alcohol use: Yes    Comment: occasionally  . Drug use: No    Home Medications Prior to Admission medications   Medication Sig Start Date End Date Taking? Authorizing Provider  albuterol (VENTOLIN HFA) 108 (90 Base) MCG/ACT inhaler Inhale 1-2 puffs into the lungs every 6 (six) hours as needed for wheezing or shortness of breath. 11/11/19   Farrel Gordon, PA-C  brompheniramine-pseudoephedrine-DM 30-2-10 MG/5ML syrup Take 3 mLs by mouth See admin instructions. 10 times daily as needed 11/07/19   [provider]  buPROPion (WELLBUTRIN XL) 300 MG 24 hr tablet Take 300 mg by mouth daily.    [provider]  busPIRone (BUSPAR) 10 MG tablet Take 10 mg by mouth 2 (two) times daily.    [provider]  dimenhyDRINATE (DRAMAMINE) 50 MG tablet Take 1 tablet (50 mg total) by mouth every 8 (eight) hours as needed for dizziness. 03/28/18   Shaune PollackIsaacs, Cameron, MD  gabapentin (NEURONTIN) 300 MG capsule Take 300 mg by mouth 3 (three) times daily.    [provider]  lamoTRIgine (LAMICTAL) 100 MG tablet Take 100 mg by mouth daily. 09/05/19   [provider]  meloxicam (MOBIC) 7.5 MG tablet Take 1 tablet (7.5 mg total) by mouth daily. 03/20/18   Palumbo, April, MD  montelukast (SINGULAIR) 10 MG tablet Take 10 mg by mouth daily. 11/07/19   [provider]    ondansetron (ZOFRAN ODT) 4 MG disintegrating tablet Take 1 tablet (4 mg total) by mouth every 8 (eight) hours as needed for nausea or vomiting. 03/28/18   Shaune PollackIsaacs, Cameron, MD  predniSONE (STERAPRED UNI-PAK 21 TAB) 10 MG (21) TBPK tablet Take 1-6 tablets by mouth See admin instructions. 6 day taper 11/07/19   [provider]    Allergies    Protonix [pantoprazole], Stadol [butorphanol], Morphine and related, Red dye, Soap, and Adhesive [tape]  Review of Systems   Review of Systems  Constitutional: Negative for chills, diaphoresis, fatigue and fever.  HENT: Negative for congestion, sore throat and trouble swallowing.   Eyes: Negative for pain and visual disturbance.  Respiratory: Positive for cough and chest tightness. Negative for shortness of breath and wheezing.   Cardiovascular: Negative for chest pain, palpitations and leg swelling.  Gastrointestinal: Negative for abdominal distention, abdominal pain, diarrhea, nausea and vomiting.  Genitourinary: Negative for difficulty urinating.  Musculoskeletal: Negative for back pain, neck pain and neck stiffness.  Skin: Negative for pallor.  Neurological: Negative for dizziness, speech difficulty, weakness and headaches.  Psychiatric/Behavioral: Negative for confusion.    Physical Exam Updated Vital Signs BP 127/81   Pulse 60   Temp 99.1 F (37.3 C) (Oral)   Resp 18   SpO2 98%   Physical Exam Constitutional:      General: She is not in acute distress.    Appearance: Normal appearance. She is not ill-appearing, toxic-appearing or diaphoretic.     Comments: Patient is sitting and talking to me in full sentences, no respiratory distress.  Handling secretions well.  Appears well.  HENT:     Head: Normocephalic and atraumatic.     Comments: No facial pain    Mouth/Throat:     Mouth: Mucous membranes are moist.     Pharynx: Oropharynx is clear. No oropharyngeal exudate or posterior oropharyngeal erythema.  Eyes:     General: No  scleral icterus.    Extraocular Movements: Extraocular movements intact.     Pupils: Pupils are equal, round, and reactive to light.  Cardiovascular:     Rate and Rhythm: Normal rate and regular rhythm.     Pulses: Normal pulses.     Heart sounds: Normal heart sounds.  Pulmonary:     Effort: Pulmonary effort is normal. No respiratory distress.     Breath sounds: Normal breath sounds. No stridor. No wheezing, rhonchi or rales.     Comments: When listening to the lungs, patient does have bronchospasm. Chest:     Chest wall: No tenderness.  Abdominal:     General: Abdomen is flat. There is no distension.     Palpations: Abdomen is soft.     Tenderness: There is no abdominal tenderness. There is  no guarding or rebound.  Musculoskeletal:        General: No swelling or tenderness. Normal range of motion.     Cervical back: Normal range of motion and neck supple. No rigidity.     Right lower leg: No edema.     Left lower leg: No edema.  Skin:    General: Skin is warm and dry.     Capillary Refill: Capillary refill takes less than 2 seconds.     Coloration: Skin is not pale.  Neurological:     General: No focal deficit present.     Mental Status: She is alert and oriented to person, place, and time.     Cranial Nerves: No cranial nerve deficit.     Sensory: No sensory deficit.     Motor: No weakness.     Coordination: Coordination normal.  Psychiatric:        Mood and Affect: Mood normal.        Behavior: Behavior normal.     ED Results / Procedures / Treatments   Labs (all labs ordered are listed, but only abnormal results are displayed) Labs Reviewed  SARS CORONAVIRUS 2 BY RT PCR [facility] ORDER, PERFORMED IN [facility] LAB)    EKG EKG Interpretation  Date/Time:  Wednesday November 11 2019 15:41:03 EDT Ventricular Rate:  60 PR Interval:    QRS Duration: 107 QT Interval:  424 QTC Calculation: 424 R Axis:   75 Text Interpretation: Sinus rhythm Nonspecific T  wave abnormality No previous tracing Confirmed by Cathren Laine ([id number]) on 11/11/2019 4:11:43 PM   Radiology DG Chest Portable 1 View  Result Date: 11/11/2019 CLINICAL DATA:  Cough. EXAM: PORTABLE CHEST 1 VIEW COMPARISON:  None. FINDINGS: The heart size and mediastinal contours are within normal limits. Both lungs are clear. The visualized skeletal structures are unremarkable. IMPRESSION: No active disease. Electronically Signed   By: Lupita Raider M.D.   On: 11/11/2019 13:50    Procedures Procedures (including critical care time)  Medications Ordered in ED Medications  ipratropium-albuterol (DUONEB) 0.5-2.5 (3) MG/3ML nebulizer solution 3 mL (3 mLs Nebulization Given 11/11/19 1541)    ED Course  I have reviewed the triage vital signs and the nursing notes.  Pertinent labs & imaging results that were available during my care of the patient were reviewed by me and considered in my medical decision making (see chart for details).    MDM Rules/Calculators/A&P                          April Madden is a 39 y.o. female with alpha-1 antitrypsin, eczema, fibromyalgia, IBS that presents the emergency department today for cough since 9/11.  No new medications, no ACE inhibitor's.  Physical exam benign, lung exam clear. Did discuss options of DuoNeb since patient is having chest tightness and this could help open up lungs, patient does want this at this time. Covid test negative. Chest x-ray without any signs of pneumonia or other cute cardiopulmonary disease, interpreted by me. Do not think that we need lab testing at this time, patient appears well, no acute distress. Will reassess after DuoNeb.  No other URI-like symptoms besides cough.  Upon reassessment patient states that she feels better after DuoNeb, states that she does not think she needs another one.  States that she wants to go home now since she feels much better.  Ready for discharge.  States that she will follow-up with primary  care.Shared decision-making about antibiotics for bronchitis.  Does not want this at this time.  We will also refer to pulmonologist since patient request one at this time due to alpha-1 antitrypsin.  Doubt need for further emergent work up at this time. I explained the diagnosis and have given explicit precautions to return to the ER including for any other new or worsening symptoms. The patient understands and accepts the medical plan as it's been dictated and I have answered their questions. Discharge instructions concerning home care and prescriptions have been given. The patient is STABLE and is discharged to home in good condition.   Final Clinical Impression(s) / ED Diagnoses Final diagnoses:  Acute bronchitis, unspecified organism    Rx / DC Orders ED Discharge Orders         Ordered    albuterol (VENTOLIN HFA) 108 (90 Base) MCG/ACT inhaler  Every 6 hours PRN        11/11/19 1646    Ambulatory referral to Pulmonology        11/11/19 1647           Farrel Gordon, PA-C 11/11/19 1704    Terrilee Files, MD 11/11/19 1921

## 2020-12-22 ENCOUNTER — Other Ambulatory Visit: Payer: Self-pay

## 2020-12-22 ENCOUNTER — Encounter (HOSPITAL_BASED_OUTPATIENT_CLINIC_OR_DEPARTMENT_OTHER): Payer: Self-pay | Admitting: *Deleted

## 2020-12-22 ENCOUNTER — Emergency Department (HOSPITAL_BASED_OUTPATIENT_CLINIC_OR_DEPARTMENT_OTHER): Payer: BC Managed Care – PPO

## 2020-12-22 DIAGNOSIS — Z20822 Contact with and (suspected) exposure to covid-19: Secondary | ICD-10-CM | POA: Diagnosis not present

## 2020-12-22 DIAGNOSIS — J069 Acute upper respiratory infection, unspecified: Secondary | ICD-10-CM | POA: Diagnosis not present

## 2020-12-22 DIAGNOSIS — Z87891 Personal history of nicotine dependence: Secondary | ICD-10-CM | POA: Insufficient documentation

## 2020-12-22 DIAGNOSIS — R059 Cough, unspecified: Secondary | ICD-10-CM | POA: Diagnosis present

## 2020-12-22 DIAGNOSIS — M542 Cervicalgia: Secondary | ICD-10-CM | POA: Insufficient documentation

## 2020-12-22 LAB — GROUP A STREP BY PCR: Group A Strep by PCR: NOT DETECTED

## 2020-12-22 NOTE — ED Triage Notes (Signed)
Cough and swelling to her throat making it hard for her to breathe. She had a negative home Covid test.

## 2020-12-23 ENCOUNTER — Emergency Department (HOSPITAL_BASED_OUTPATIENT_CLINIC_OR_DEPARTMENT_OTHER)
Admission: EM | Admit: 2020-12-23 | Discharge: 2020-12-23 | Disposition: A | Discharge status: Home / Self Care | Payer: BC Managed Care – PPO | Attending: Emergency Medicine | Admitting: Emergency Medicine

## 2020-12-23 DIAGNOSIS — J069 Acute upper respiratory infection, unspecified: Secondary | ICD-10-CM

## 2020-12-23 LAB — RESP PANEL BY RT-PCR (FLU A&B, COVID) ARPGX2
Influenza A by PCR: NEGATIVE
Influenza B by PCR: NEGATIVE
SARS Coronavirus 2 by RT PCR: NEGATIVE

## 2020-12-23 MED ORDER — DEXAMETHASONE 4 MG PO TABS
10.0000 mg | ORAL_TABLET | Freq: Once | ORAL | Status: AC
  Administered 2020-12-23: 10 mg via ORAL
  Filled 2020-12-23: qty 3

## 2020-12-23 MED ORDER — LIDOCAINE VISCOUS HCL 2 % MT SOLN
15.0000 mL | Freq: Once | OROMUCOSAL | Status: AC
  Administered 2020-12-23: 15 mL via OROMUCOSAL
  Filled 2020-12-23: qty 15

## 2020-12-23 NOTE — ED Notes (Signed)
Pt does not want to stay for paperwork, discussed with pt to return for worsening symptoms

## 2020-12-23 NOTE — ED Provider Notes (Signed)
[facility] EMERGENCY DEPARTMENT Provider Note   CSN: [id number] Arrival date & time: 12/22/20  2204     History Chief Complaint  Patient presents with   Cough    April Madden is a 40 y.o. female.  The history is provided by the patient.  Cough April Madden is a 40 y.o. female who presents to the Emergency Department complaining of sore throat and cough.  She presents to the ED for evaluation of sxs that started on Tuesday with nasal congestion and cough/congestion.  Had fever Tuesday/Wednesday to 99.5.  has sore throat since yesterday, feels like it is swollen.  Feels dehydrated.  Drinking less than usual.  Works as a Runner, broadcasting/film/video.  Vaccinations UTD.  Fully vaccinated for covid 19.  Received flu vaccine 10/1.      Past Medical History:  Diagnosis Date   Alpha 1-antitrypsin PiMS phenotype    deficiency   Bipolar disorder (HCC)    Complication of anesthesia    hard to wake up after colonoscopy, per pt.   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 - has never required any treatment   Insomnia    Irritable bowel syndrome (IBS)    Migraines     There are no problems to display for this patient.   Past Surgical History:  Procedure Laterality Date   ABDOMINAL HYSTERECTOMY     BILATERAL SALPINGECTOMY     BLADDER SUSPENSION     CHOLECYSTECTOMY     COLONOSCOPY     CYSTOCELE REPAIR     LAPAROSCOPIC ENDOMETRIOSIS FULGURATION     x 4-5, per pt.   OOPHORECTOMY     unilateral   RECTOCELE REPAIR     TENDON REPAIR Right 01/05/2016   Procedure: EXCISION OF RIGHT FIFTH METARSAL BONE, REPAIR OF PERONEAL TENDON;  Surgeon: Toni Arthurs, MD;  Location: [facility];  Service: Orthopedics;  Laterality: Right;     OB History   No obstetric history on file.     No family history on file.  Social History   Tobacco Use   Smoking status: Former   Smokeless tobacco:  Never  Building services engineer Use: Never used  Substance Use Topics   Alcohol use: Yes    Comment: occasionally   Drug use: No    Home Medications Prior to Admission medications   Medication Sig Start Date End Date Taking? Authorizing Provider  albuterol (VENTOLIN HFA) 108 (90 Base) MCG/ACT inhaler Inhale 1-2 puffs into the lungs every 6 (six) hours as needed for wheezing or shortness of breath. 11/11/19  Yes Patel, Shalyn, PA-C  gabapentin (NEURONTIN) 300 MG capsule Take 300 mg by mouth 3 (three) times daily.   Yes [provider]  lamoTRIgine (LAMICTAL) 100 MG tablet Take 100 mg by mouth daily. 09/05/19  Yes [provider]  meloxicam (MOBIC) 7.5 MG tablet Take 1 tablet (7.5 mg total) by mouth daily. 03/20/18  Yes Palumbo, April, MD  brompheniramine-pseudoephedrine-DM 30-2-10 MG/5ML syrup Take 3 mLs by mouth See admin instructions. 10 times daily as needed 11/07/19   [provider]  buPROPion (WELLBUTRIN XL) 300 MG 24 hr tablet Take 300 mg by mouth daily.    [provider]  busPIRone (BUSPAR) 10 MG tablet Take 10 mg by mouth 2 (two) times daily.    [provider]  dimenhyDRINATE (DRAMAMINE)  50 MG tablet Take 1 tablet (50 mg total) by mouth every 8 (eight) hours as needed for dizziness. 03/28/18   Shaune Pollack, MD  montelukast (SINGULAIR) 10 MG tablet Take 10 mg by mouth daily. 11/07/19   [provider]  ondansetron (ZOFRAN ODT) 4 MG disintegrating tablet Take 1 tablet (4 mg total) by mouth every 8 (eight) hours as needed for nausea or vomiting. 03/28/18   Shaune Pollack, MD  predniSONE (STERAPRED UNI-PAK 21 TAB) 10 MG (21) TBPK tablet Take 1-6 tablets by mouth See admin instructions. 6 day taper 11/07/19   [provider]    Allergies    Protonix [pantoprazole], Stadol [butorphanol], Morphine and related, Red dye, Soap, and Adhesive [tape]  Review of Systems   Review of Systems  Respiratory:  Positive for cough.   All other  systems reviewed and are negative.  Physical Exam Updated Vital Signs BP 139/87   Pulse 69   Temp 98.4 F (36.9 C) (Oral)   Resp 16   Ht 5\' 4"  (1.626 m)   Wt 96.6 kg   SpO2 99%   BMI 36.56 kg/m   Physical Exam Vitals and nursing note reviewed.  Constitutional:      Appearance: She is well-developed.  HENT:     Head: Normocephalic and atraumatic.     Comments: Erythema in posterior OP without significant edema.  No exudates.  Cardiovascular:     Rate and Rhythm: Normal rate and regular rhythm.     Heart sounds: No murmur heard. Pulmonary:     Effort: Pulmonary effort is normal. No respiratory distress.     Breath sounds: Normal breath sounds.  Musculoskeletal:        General: No tenderness.  Skin:    General: Skin is warm and dry.  Neurological:     Mental Status: She is alert and oriented to person, place, and time.  Psychiatric:        Behavior: Behavior normal.    ED Results / Procedures / Treatments   Labs (all labs ordered are listed, but only abnormal results are displayed) Labs Reviewed  GROUP A STREP BY PCR  RESP PANEL BY RT-PCR (FLU A&B, COVID) [id number]    EKG None  Radiology DG Neck Soft Tissue  Result Date: 12/22/2020 CLINICAL DATA:  Neck pain. EXAM: NECK SOFT TISSUES - 1+ VIEW COMPARISON:  None. FINDINGS: There is no evidence of retropharyngeal soft tissue swelling or epiglottic enlargement. The cervical airway is unremarkable and no radio-opaque foreign body identified. IMPRESSION: Negative. Electronically Signed   By: 12/24/2020 M.D.   On: 12/22/2020 22:40    Procedures Procedures   Medications Ordered in ED Medications  lidocaine (XYLOCAINE) 2 % viscous mouth solution 15 mL (15 mLs Mouth/Throat Given 12/23/20 0150)  dexamethasone (DECADRON) tablet 10 mg (10 mg Oral Given 12/23/20 0150)    ED Course  I have reviewed the triage vital signs and the nursing notes.  Pertinent labs & imaging results that were available during my care of  the patient were reviewed by me and considered in my medical decision making (see chart for details).    MDM Rules/Calculators/A&P                          patient with history of alpha-1 Anitrypsin deficiency here for evaluation of nasal congestion, cough and sore throat. On evaluation she has no respiratory distress with good air movement bilaterally. No stridor. No evidence of strep pharyngitis.  No clinical evidence of pneumonia. She is negative for COVID-19 as well as influenza. She was treated with a dose of Decadron for her sore throat. Discussed with patient home care for viral URI with outpatient follow-up and return precautions.  Final Clinical Impression(s) / ED Diagnoses Final diagnoses:  Viral URI with cough    Rx / DC Orders ED Discharge Orders     None        Tilden Fossa, MD 12/23/20 [phone]

## 2021-05-02 IMAGING — DX DG CHEST 1V PORT
1 series · 1 of 1 positions shown · non-contrast
Comparison: None.

CLINICAL DATA: Cough.

EXAM:
PORTABLE CHEST 1 VIEW

[chest ap]
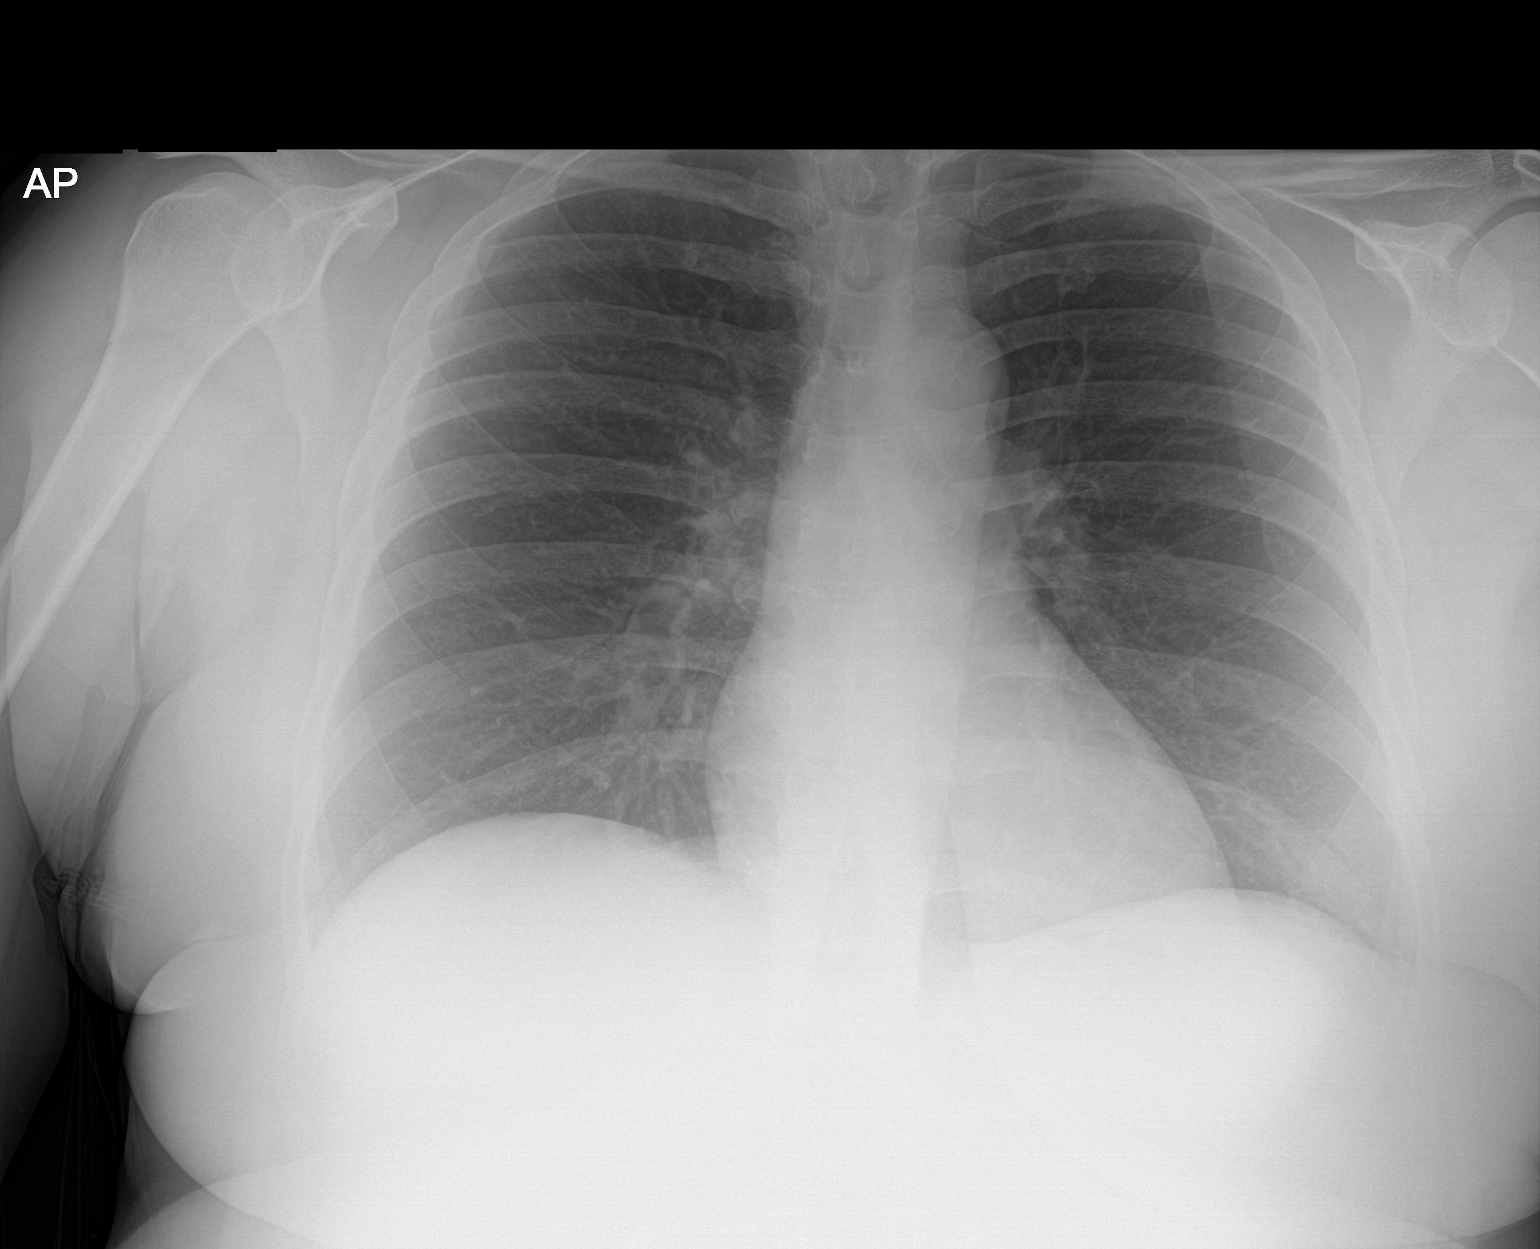

[1 of 1 positions shown; findings below may reference images not displayed]

FINDINGS: The heart size and mediastinal contours are within normal limits.
Both lungs are clear. The visualized skeletal structures are
unremarkable.
IMPRESSION: No active disease.

## 2021-10-26 ENCOUNTER — Emergency Department (HOSPITAL_BASED_OUTPATIENT_CLINIC_OR_DEPARTMENT_OTHER): Payer: BC Managed Care – PPO

## 2021-10-26 ENCOUNTER — Other Ambulatory Visit: Payer: Self-pay

## 2021-10-26 ENCOUNTER — Emergency Department (HOSPITAL_BASED_OUTPATIENT_CLINIC_OR_DEPARTMENT_OTHER)
Admission: EM | Admit: 2021-10-26 | Discharge: 2021-10-26 | Disposition: A | Discharge status: Home / Self Care | Payer: BC Managed Care – PPO | Attending: Emergency Medicine | Admitting: Emergency Medicine

## 2021-10-26 ENCOUNTER — Encounter (HOSPITAL_BASED_OUTPATIENT_CLINIC_OR_DEPARTMENT_OTHER): Payer: Self-pay

## 2021-10-26 DIAGNOSIS — M25571 Pain in right ankle and joints of right foot: Secondary | ICD-10-CM | POA: Diagnosis present

## 2021-10-26 DIAGNOSIS — L03311 Cellulitis of abdominal wall: Secondary | ICD-10-CM | POA: Insufficient documentation

## 2021-10-26 MED ORDER — DOXYCYCLINE MONOHYDRATE 100 MG PO TABS: 100.0000 mg | ORAL_TABLET | Freq: Two times a day (BID) | ORAL | 0 refills | Status: AC

## 2021-10-26 NOTE — ED Triage Notes (Signed)
Patient c/o lower abd abscess that has needed to be drained twice. Patient was placed on antibiotics.  Patient has been on clindamycin x Sunday.  Patient also complains of right ankle pain.

## 2021-10-26 NOTE — Discharge Instructions (Addendum)
Patient will stop clindamycin and start doxycycline. Warm compresses to the abdominal wall 2-3 times a day. Tylenol, ibuprofen as needed for pain. Patient will use ankle brace and crutches which she already have at home. If symptoms do not improve patient will return to emergency department for evaluation.

## 2021-10-26 NOTE — ED Provider Notes (Signed)
[facility] EMERGENCY DEPARTMENT Provider Note   CSN: [id number] Arrival date & time: 10/26/21  1810     History  Chief Complaint  Patient presents with   Abscess   Ankle Pain    April Madden is a 41 y.o. female.  41 year old female presented to emergency department with complaints of right ankle pain and abdominal wall abscess. Patient states she had a talar fracture and underwent surgical repair of the fracture including ligament and tendon repair of the right foot.  Patient reports her right foot and ankle started bothering a week ago, worse with weightbearing.  Denies new injury or trauma. Patient also complaining of abdominal wall abscess which was lanced without packing 2 weeks ago.  Patient was started on cephalexin.  Patient continued the antibiotic however symptoms did not improve.  Were seen at another urgent care last week Saturday.  Antibiotic was switched from Keflex to clindamycin and wound care instructions were given.  Patient taking clindamycin as advised with no improvement in symptoms.  Patient denies fever or chills.  Denies drainage from the abdominal wall abscess site.  The history is provided by the patient. No language interpreter was used.  Abscess Abscess quality: induration, painful, redness and warmth   Abscess quality: not draining, no fluctuance, no itching and not weeping   Red streaking: no   Associated symptoms: no fever, no nausea and no vomiting   Ankle Pain Associated symptoms: no fever        Home Medications Prior to Admission medications   Medication Sig Start Date End Date Taking? Authorizing Provider  doxycycline (ADOXA) 100 MG tablet Take 1 tablet (100 mg total) by mouth 2 (two) times daily for 10 days. 10/26/21 11/05/21 Yes Wendie Diskin, MD  albuterol (VENTOLIN HFA) 108 (90 Base) MCG/ACT inhaler Inhale 1-2 puffs into the lungs every 6 (six) hours as needed for wheezing or shortness of breath. 11/11/19   Farrel Gordon,  PA-C  brompheniramine-pseudoephedrine-DM 30-2-10 MG/5ML syrup Take 3 mLs by mouth See admin instructions. 10 times daily as needed 11/07/19   [provider]  buPROPion (WELLBUTRIN XL) 300 MG 24 hr tablet Take 300 mg by mouth daily.    [provider]  busPIRone (BUSPAR) 10 MG tablet Take 10 mg by mouth 2 (two) times daily.    [provider]  dimenhyDRINATE (DRAMAMINE) 50 MG tablet Take 1 tablet (50 mg total) by mouth every 8 (eight) hours as needed for dizziness. 03/28/18   Shaune Pollack, MD  gabapentin (NEURONTIN) 300 MG capsule Take 300 mg by mouth 3 (three) times daily.    [provider]  lamoTRIgine (LAMICTAL) 100 MG tablet Take 100 mg by mouth daily. 09/05/19   [provider]  meloxicam (MOBIC) 7.5 MG tablet Take 1 tablet (7.5 mg total) by mouth daily. 03/20/18   Palumbo, April, MD  montelukast (SINGULAIR) 10 MG tablet Take 10 mg by mouth daily. 11/07/19   [provider]  ondansetron (ZOFRAN ODT) 4 MG disintegrating tablet Take 1 tablet (4 mg total) by mouth every 8 (eight) hours as needed for nausea or vomiting. 03/28/18   Shaune Pollack, MD  predniSONE (STERAPRED UNI-PAK 21 TAB) 10 MG (21) TBPK tablet Take 1-6 tablets by mouth See admin instructions. 6 day taper 11/07/19   [provider]      Allergies    Protonix [pantoprazole], Stadol [butorphanol], Morphine and related, Red dye, Soap, and Adhesive [tape]    Review of Systems   Review of Systems  Constitutional:  Negative for chills and fever.  Gastrointestinal:  Negative for abdominal pain, nausea and vomiting.  Musculoskeletal:  Positive for joint swelling.  Skin:  Positive for color change.    Physical Exam Updated Vital Signs BP (!) 147/91 (BP Location: Right Arm)   Pulse 72   Temp 98.5 F (36.9 C) (Oral)   Resp 19   Ht 5\' 4"  (1.626 m)   Wt 96.2 kg   SpO2 99%   BMI 36.39 kg/m  Physical Exam Vitals and nursing note reviewed.  Constitutional:       Appearance: Normal appearance.  HENT:     Head: Normocephalic and atraumatic.  Cardiovascular:     Rate and Rhythm: Normal rate and regular rhythm.  Abdominal:     Tenderness: There is no abdominal tenderness. There is no guarding.       Comments: Redness skin warm to touch.  No fluctuation  Musculoskeletal:        General: Swelling (Full range of motion in the foot.  Pain slightly worsens with extension of the foot.  Pedal pulse intact.  Neurovascular intact.) and tenderness (Tenderness to the lateral malleolus, on and around talus and mild tenderness to inferior aspect of Achilles tendon.) present. No deformity or signs of injury. Normal range of motion.       Legs:     Comments: Swelling, tenderness to palpation.  No overlying redness.  Skin:    General: Skin is warm.     Capillary Refill: Capillary refill takes less than 2 seconds.     Findings: Erythema (Small superficial open wound with erythema, nodularity noted to abdominal wall above the suprapubic area.  No fluctuation or drainage appreciated.  Skin warm to touch.) present. No bruising.  Neurological:     General: No focal deficit present.     Mental Status: She is alert and oriented to person, place, and time. Mental status is at baseline.     ED Results / Procedures / Treatments   Labs (all labs ordered are listed, but only abnormal results are displayed) Labs Reviewed - No data to display  EKG None  Radiology DG Ankle Complete Right  Result Date: 10/26/2021 CLINICAL DATA:  Swelling and pain. EXAM: RIGHT ANKLE - COMPLETE 3+ VIEW COMPARISON:  None Available. FINDINGS: There is diffuse ankle soft tissue swelling, particularly laterally. There is mild cortical irregularity and lucency of the lateral corner of the lateral talar dome. Joint spaces are well maintained and alignment is anatomic. IMPRESSION: 1. Findings suspicious for osteochondral injury of the lateral talar dome. 2. Soft tissue swelling of the ankle.  Electronically Signed   By: 10/28/2021 M.D.   On: 10/26/2021 19:15    Procedures Procedures    Medications Ordered in ED Medications - No data to display  ED Course/ Medical Decision Making/ A&P                           Medical Decision Making 41 year old female presented to emergency department for right foot/ankle pain and abdominal wall abscess. Foot/ankle pain without known injury.  Patient had a surgery on the right foot for talus fracture.  After the examination I highly suspect the pain in the foot is from the strain.  Full ROM in the foot and ankle. Skin cool to touch.No concerns for cellulitis. NV Intact. Pedal pulses Intact.  X-ray of the ankle is negative for acute abnormality. Patient will use ankle brace and crutches.  Avoid weightbearing.  If the symptoms do not improve patient will follow-up with the orthopedic surgeon for further evaluation.  X-ray findings were discussed with the patient.  Abdominal abscess not responding to the prescribed antibiotic.  There is no fluctuation or drainage.  There is no concerns for worsening cellulitis or acute intra-abdominal process.  I will switch clindamycin to doxycycline.  Wound care instructions were given.  If symptoms do not improve in 3 to 5 days patient will return to emergency department for evaluation.  Patient in no distress. Detailed discussions were had with the patient regarding current findings, and need for close f/u with PCP or on call doctor. The patient has been instructed to return immediately if the symptoms worsen in any way for re-evaluation. Patient verbalized understanding and is in agreement with current care plan. All questions answered prior to discharge.    Amount and/or Complexity of Data Reviewed Radiology: ordered.          Final Clinical Impression(s) / ED Diagnoses Final diagnoses:  Cellulitis, abdominal wall  Acute right ankle pain    Rx / DC Orders ED Discharge Orders           Ordered    doxycycline (ADOXA) 100 MG tablet  2 times daily        10/26/21 2203              Reinaldo Raddle, MD 10/26/21 2226    Melene Plan, DO 10/27/21 0002

## 2022-04-26 ENCOUNTER — Other Ambulatory Visit: Payer: Self-pay

## 2022-04-26 MED ORDER — WEGOVY 0.25 MG/0.5ML ~~LOC~~ SOAJ
SUBCUTANEOUS | 0 refills | Status: AC
  Filled 2022-04-26: qty 2, 28d supply, fill #0

## 2022-06-13 IMAGING — DX DG NECK SOFT TISSUE
2 series · 2 of 2 positions shown · non-contrast
Comparison: None.

CLINICAL DATA: Neck pain.

EXAM:
NECK SOFT TISSUES - 1+ VIEW

[neck lat]
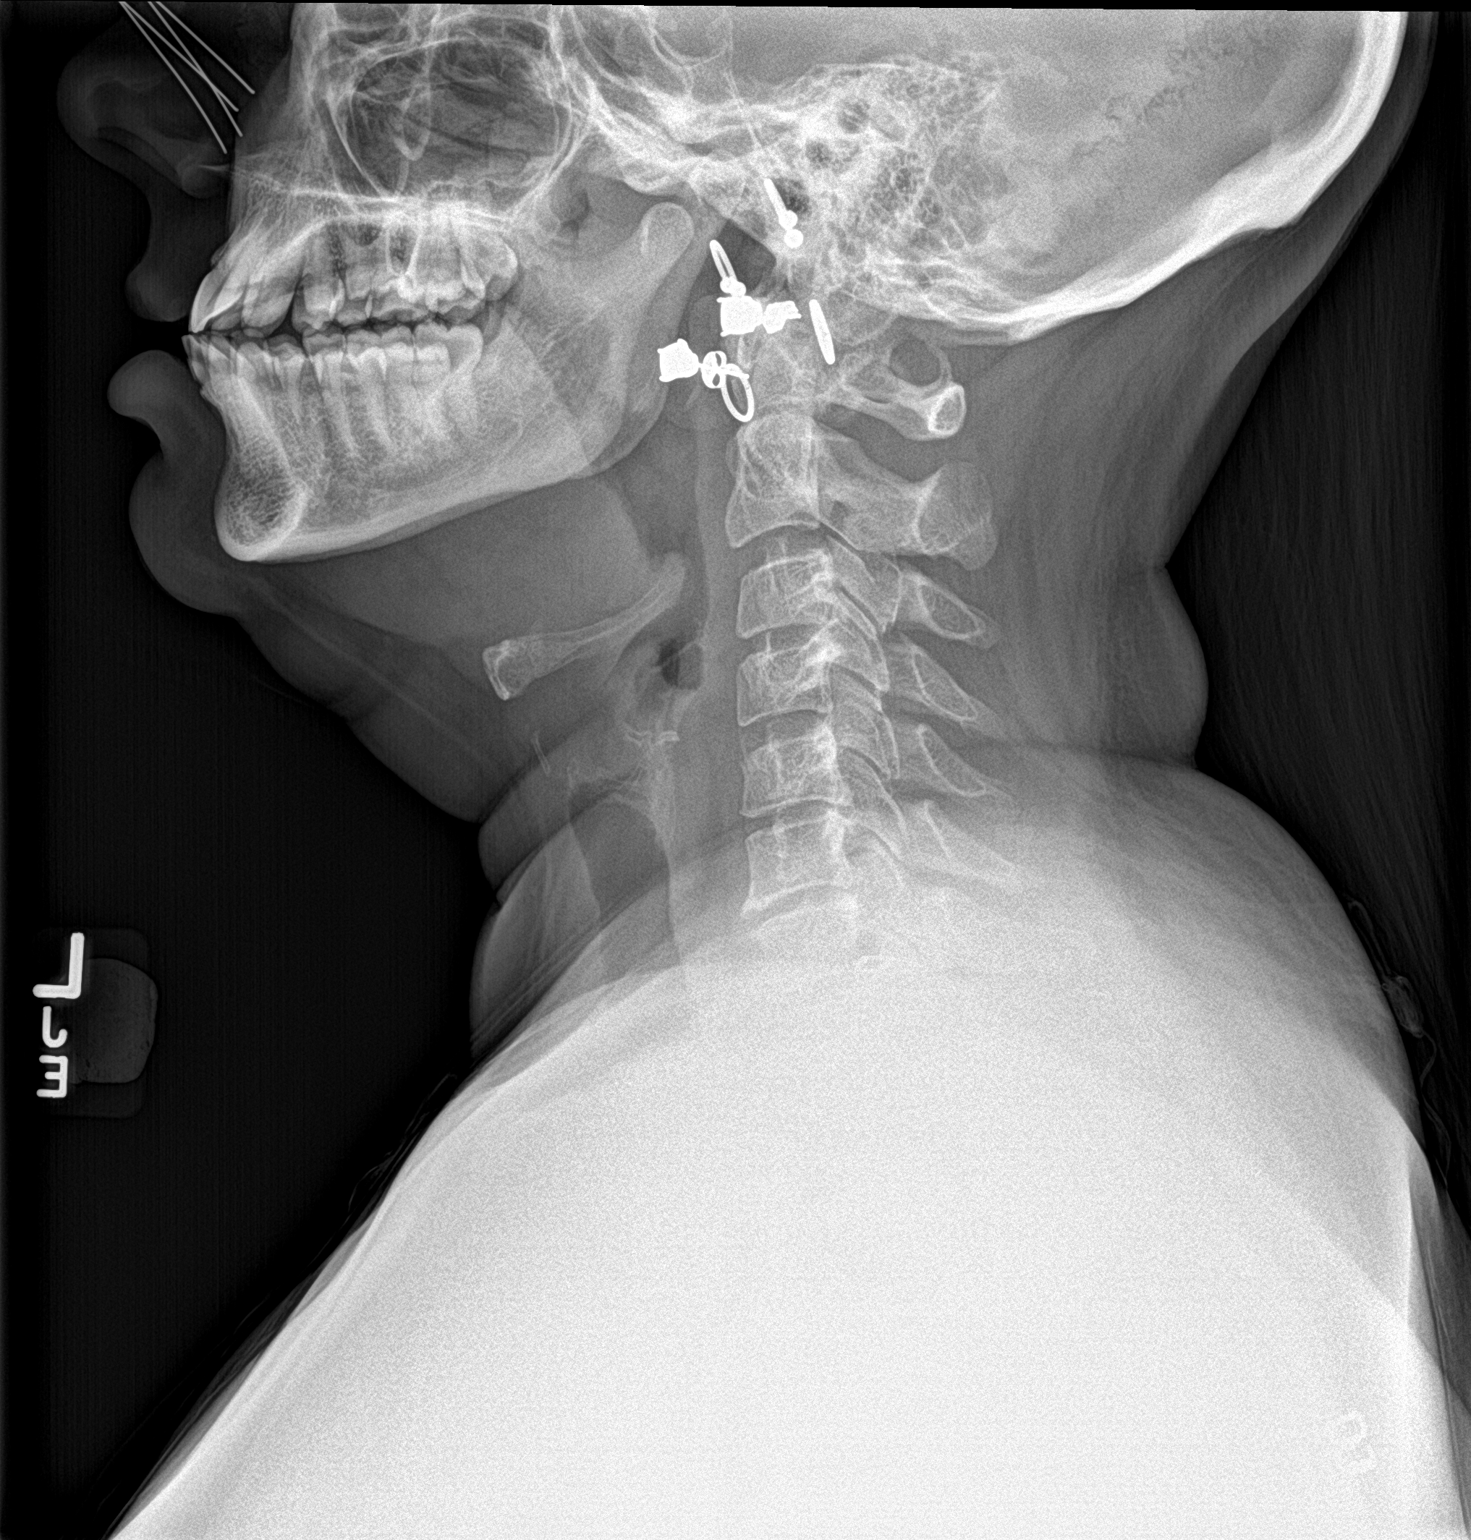

[neck ap]
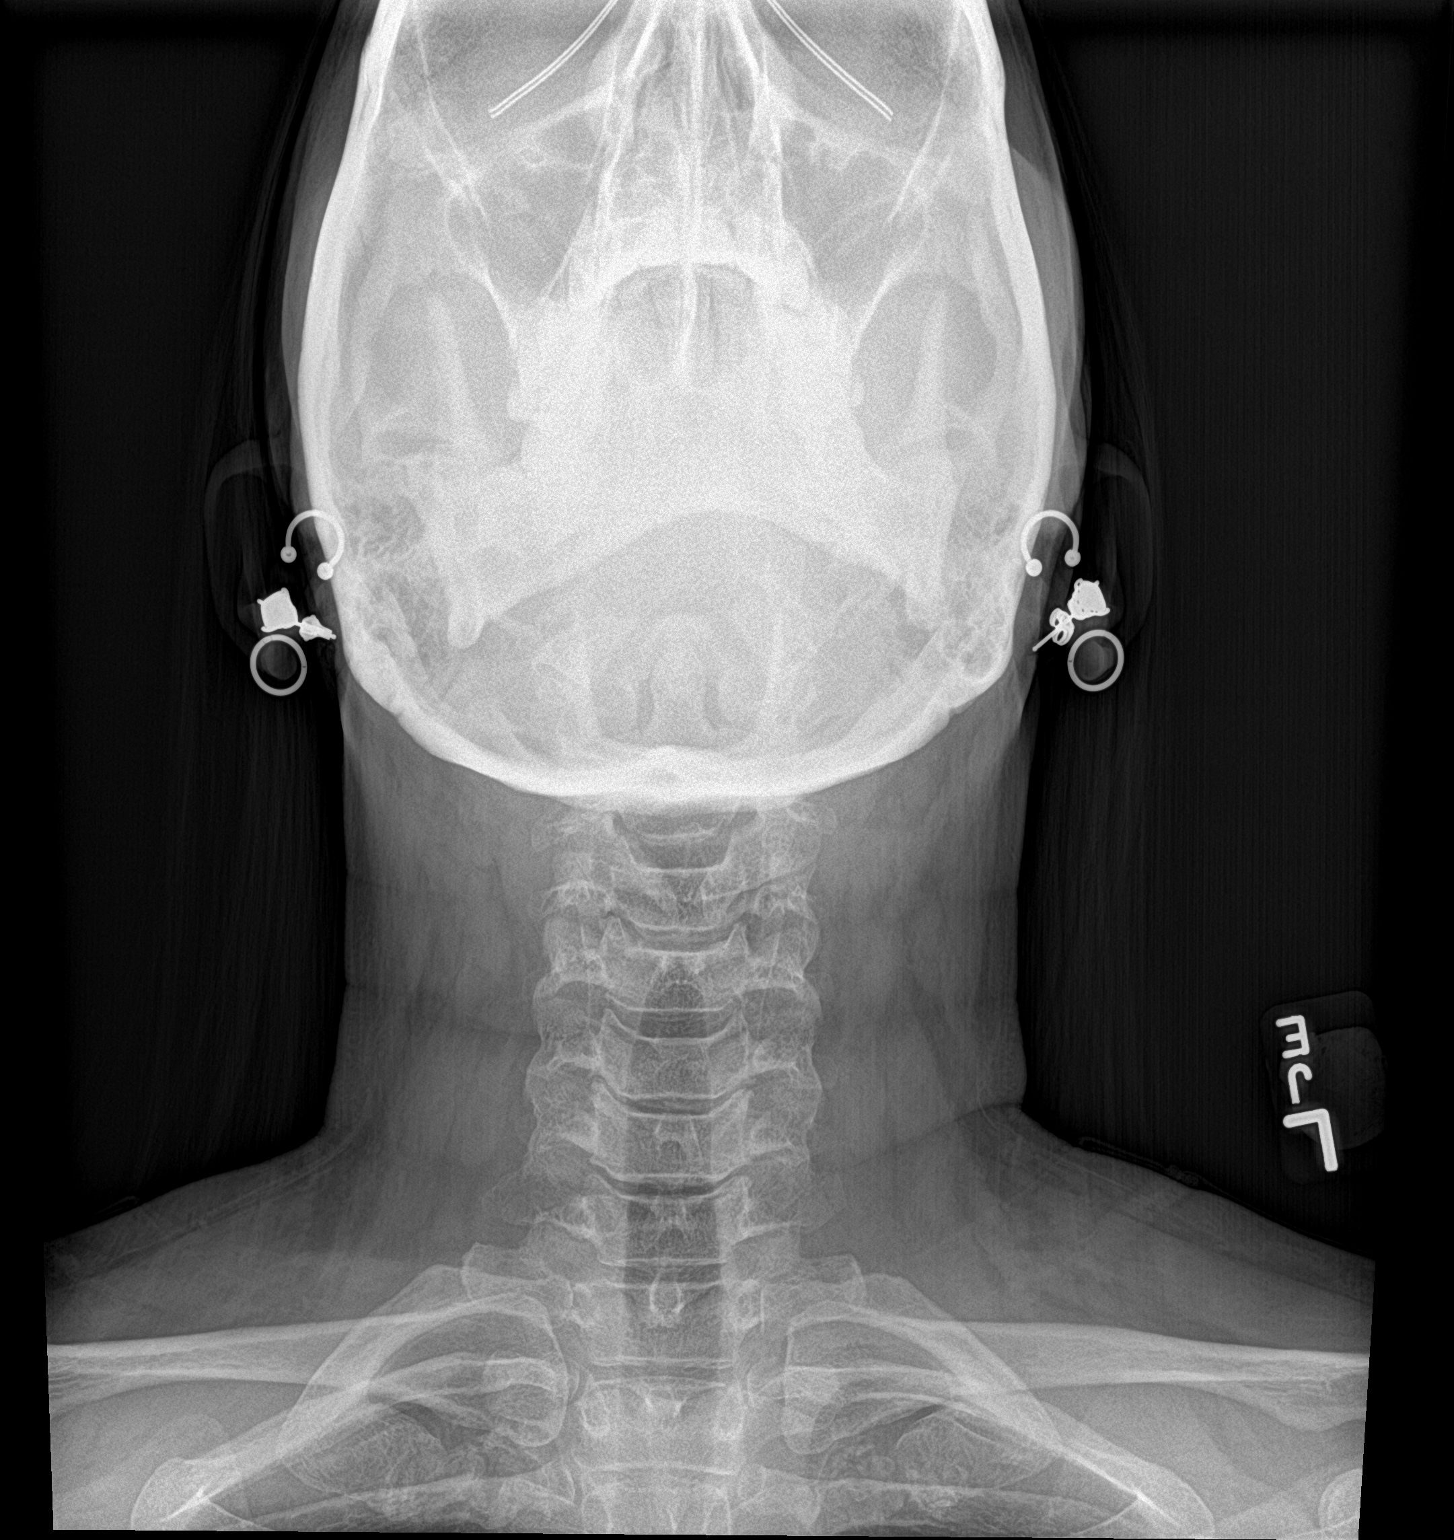

[2 of 2 positions shown; findings below may reference images not displayed]

FINDINGS: There is no evidence of retropharyngeal soft tissue swelling or
epiglottic enlargement. The cervical airway is unremarkable and no
radio-opaque foreign body identified.
IMPRESSION: Negative.

## 2022-08-15 ENCOUNTER — Other Ambulatory Visit: Payer: Self-pay

## 2022-08-15 ENCOUNTER — Encounter (HOSPITAL_BASED_OUTPATIENT_CLINIC_OR_DEPARTMENT_OTHER): Payer: Self-pay

## 2022-08-15 ENCOUNTER — Emergency Department (HOSPITAL_BASED_OUTPATIENT_CLINIC_OR_DEPARTMENT_OTHER): Payer: BC Managed Care – PPO

## 2022-08-15 ENCOUNTER — Emergency Department (HOSPITAL_BASED_OUTPATIENT_CLINIC_OR_DEPARTMENT_OTHER)
Admission: EM | Admit: 2022-08-15 | Discharge: 2022-08-15 | Disposition: A | Discharge status: Home / Self Care | Payer: BC Managed Care – PPO | Attending: Emergency Medicine | Admitting: Emergency Medicine

## 2022-08-15 DIAGNOSIS — R1011 Right upper quadrant pain: Secondary | ICD-10-CM | POA: Diagnosis present

## 2022-08-15 DIAGNOSIS — D72829 Elevated white blood cell count, unspecified: Secondary | ICD-10-CM | POA: Diagnosis not present

## 2022-08-15 DIAGNOSIS — R109 Unspecified abdominal pain: Secondary | ICD-10-CM

## 2022-08-15 DIAGNOSIS — R7401 Elevation of levels of liver transaminase levels: Secondary | ICD-10-CM | POA: Insufficient documentation

## 2022-08-15 LAB — URINALYSIS, ROUTINE W REFLEX MICROSCOPIC
Bilirubin Urine: NEGATIVE
Glucose, UA: NEGATIVE mg/dL
Ketones, ur: NEGATIVE mg/dL
Leukocytes,Ua: NEGATIVE
Nitrite: NEGATIVE
Protein, ur: NEGATIVE mg/dL
Specific Gravity, Urine: 1.015 (ref 1.005–1.030)
pH: 5.5 (ref 5.0–8.0)

## 2022-08-15 LAB — CBC
HCT: 42.3 % (ref 36.0–46.0)
Hemoglobin: 13.7 g/dL (ref 12.0–15.0)
MCH: 29.3 pg (ref 26.0–34.0)
MCHC: 32.4 g/dL (ref 30.0–36.0)
MCV: 90.6 fL (ref 80.0–100.0)
Platelets: 309 10*3/uL (ref 150–400)
RBC: 4.67 MIL/uL (ref 3.87–5.11)
RDW: 12.7 % (ref 11.5–15.5)
WBC: 13.5 10*3/uL — ABNORMAL HIGH (ref 4.0–10.5)
nRBC: 0 % (ref 0.0–0.2)

## 2022-08-15 LAB — LIPASE, BLOOD: Lipase: 26 U/L (ref 11–51)

## 2022-08-15 LAB — COMPREHENSIVE METABOLIC PANEL
ALT: 26 U/L (ref 0–44)
AST: 23 U/L (ref 15–41)
Albumin: 3.7 g/dL (ref 3.5–5.0)
Alkaline Phosphatase: 59 U/L (ref 38–126)
Anion gap: 11 (ref 5–15)
BUN: 15 mg/dL (ref 6–20)
CO2: 22 mmol/L (ref 22–32)
Calcium: 8.3 mg/dL — ABNORMAL LOW (ref 8.9–10.3)
Chloride: 103 mmol/L (ref 98–111)
Creatinine, Ser: 0.63 mg/dL (ref 0.44–1.00)
GFR, Estimated: >60 mL/min (ref >60)
Glucose, Bld: 103 mg/dL — ABNORMAL HIGH (ref 70–99)
Potassium: 4.3 mmol/L (ref 3.5–5.1)
Sodium: 136 mmol/L (ref 135–145)
Total Bilirubin: 0.4 mg/dL (ref 0.3–1.2)
Total Protein: 7 g/dL (ref 6.5–8.1)

## 2022-08-15 LAB — URINALYSIS, MICROSCOPIC (REFLEX)

## 2022-08-15 MED ORDER — CYCLOBENZAPRINE HCL 10 MG PO TABS: 10.0000 mg | ORAL_TABLET | Freq: Two times a day (BID) | ORAL | 0 refills | Status: AC | PRN

## 2022-08-15 MED ORDER — PREDNISONE 50 MG PO TABS: 60.0000 mg | ORAL_TABLET | Freq: Once | ORAL | Status: DC

## 2022-08-15 MED ORDER — HYDROMORPHONE HCL 1 MG/ML IJ SOLN
0.5000 mg | Freq: Once | INTRAMUSCULAR | Status: AC
  Administered 2022-08-15: 0.5 mg via INTRAVENOUS
  Filled 2022-08-15: qty 1

## 2022-08-15 MED ORDER — KETOROLAC TROMETHAMINE 10 MG PO TABS: 10.0000 mg | ORAL_TABLET | Freq: Four times a day (QID) | ORAL | 0 refills | Status: AC | PRN

## 2022-08-15 MED ORDER — OXYCODONE HCL 5 MG PO TABS
5.0000 mg | ORAL_TABLET | Freq: Once | ORAL | Status: AC
  Administered 2022-08-15: 5 mg via ORAL
  Filled 2022-08-15: qty 1

## 2022-08-15 MED ORDER — KETOROLAC TROMETHAMINE 15 MG/ML IJ SOLN
15.0000 mg | Freq: Once | INTRAMUSCULAR | Status: AC
  Administered 2022-08-15: 15 mg via INTRAVENOUS
  Filled 2022-08-15: qty 1

## 2022-08-15 MED ORDER — IOHEXOL 350 MG/ML SOLN
100.0000 mL | Freq: Once | INTRAVENOUS | Status: AC | PRN
  Administered 2022-08-15: 100 mL via INTRAVENOUS

## 2022-08-15 MED ORDER — PRAMIPEXOLE DIHYDROCHLORIDE 0.25 MG PO TABS: 0.5000 mg | ORAL_TABLET | Freq: Three times a day (TID) | ORAL | Status: DC

## 2022-08-15 MED ORDER — METHOCARBAMOL 500 MG PO TABS
750.0000 mg | ORAL_TABLET | Freq: Once | ORAL | Status: AC
  Administered 2022-08-15: 750 mg via ORAL
  Filled 2022-08-15: qty 2

## 2022-08-15 MED ORDER — FENTANYL CITRATE PF 50 MCG/ML IJ SOSY
25.0000 ug | PREFILLED_SYRINGE | Freq: Once | INTRAMUSCULAR | Status: AC
  Administered 2022-08-15: 25 ug via INTRAVENOUS
  Filled 2022-08-15: qty 1

## 2022-08-15 MED ORDER — OXYCODONE-ACETAMINOPHEN 5-325 MG PO TABS: 1.0000 | ORAL_TABLET | Freq: Four times a day (QID) | ORAL | 0 refills | Status: AC | PRN

## 2022-08-15 MED ORDER — ORPHENADRINE CITRATE ER 100 MG PO TB12: 100.0000 mg | ORAL_TABLET | Freq: Once | ORAL | Status: DC

## 2022-08-15 NOTE — ED Provider Notes (Signed)
[place] [facility]ERGENCY DEPARTMENT AT [facility] Provider Note   CSN: [id number] Arrival date & time: 08/15/22  1722     History  Chief Complaint  Patient presents with   Back Pain   Abdominal Pain    April Madden is a 42 y.o. female, history of IBS, fibromyalgia, who presents to the ED secondary to right-sided flank pain that is been going on for the last couple days.  She stated it started on Sunday, and she initially thought she had pulled a muscle, as she was bringing a rug into the house, a couple days before.  She notes that it is intermittent and varies, and felt like sharp stabbing labor pains, but would resolve.  Has progressively gotten worse has went to urgent care, had negative CT renal scan today.  She initially thought she had a kidney stone, but it showed no kidney stone.  Notes that she cannot take the pain so that is why she decided come here today.  States the pain is worse when she takes a deep breath, is on the right flank, especially the right mid back, rating to the right upper quadrant.  She has a history of cholecystectomy, hysterectomy, and oophorectomy, as well as salpingectomy.  Denies any nausea or vomiting.  Denies any urinary symptoms, or vaginal bleeding.  Has taken Zanaflex, ibuprofen, and Tylenol without any relief.  Home Medications Prior to Admission medications   Medication Sig Start Date End Date Taking? Authorizing Provider  cyclobenzaprine (FLEXERIL) 10 MG tablet Take 1 tablet (10 mg total) by mouth 2 (two) times daily as needed for muscle spasms. 08/15/22  Yes Varnika Butz L, PA  ketorolac (TORADOL) 10 MG tablet Take 1 tablet (10 mg total) by mouth every 6 (six) hours as needed. 08/15/22  Yes Lucile Hillmann L, PA  oxyCODONE-acetaminophen (PERCOCET/ROXICET) 5-325 MG tablet Take 1 tablet by mouth every 6 (six) hours as needed for severe pain. 08/15/22  Yes Guerin Lashomb L, PA  albuterol (VENTOLIN HFA) 108 (90 Base) MCG/ACT inhaler Inhale 1-2  puffs into the lungs every 6 (six) hours as needed for wheezing or shortness of breath. 11/11/19   Farrel Gordon, PA-C  brompheniramine-pseudoephedrine-DM 30-2-10 MG/5ML syrup Take 3 mLs by mouth See admin instructions. 10 times daily as needed 11/07/19   [provider]  buPROPion (WELLBUTRIN XL) 300 MG 24 hr tablet Take 300 mg by mouth daily.    [provider]  busPIRone (BUSPAR) 10 MG tablet Take 10 mg by mouth 2 (two) times daily.    [provider]  dimenhyDRINATE (DRAMAMINE) 50 MG tablet Take 1 tablet (50 mg total) by mouth every 8 (eight) hours as needed for dizziness. 03/28/18   Shaune Pollack, MD  gabapentin (NEURONTIN) 300 MG capsule Take 300 mg by mouth 3 (three) times daily.    [provider]  lamoTRIgine (LAMICTAL) 100 MG tablet Take 100 mg by mouth daily. 09/05/19   [provider]  meloxicam (MOBIC) 7.5 MG tablet Take 1 tablet (7.5 mg total) by mouth daily. 03/20/18   Palumbo, April, MD  montelukast (SINGULAIR) 10 MG tablet Take 10 mg by mouth daily. 11/07/19   [provider]  ondansetron (ZOFRAN ODT) 4 MG disintegrating tablet Take 1 tablet (4 mg total) by mouth every 8 (eight) hours as needed for nausea or vomiting. 03/28/18   Shaune Pollack, MD  predniSONE (STERAPRED UNI-PAK 21 TAB) 10 MG (21) TBPK tablet Take 1-6 tablets by mouth See admin instructions. 6 day taper  11/07/19   [provider]  Semaglutide-Weight Management (WEGOVY) 0.25 MG/0.5ML SOAJ Inject 0.5 mLs (0.25 mg total) into the skin every 7 days for 28 days. Then call PCP for refill at 0.5mg  dose. 04/26/22         Allergies    Protonix [pantoprazole], Stadol [butorphanol], Morphine and codeine, Red dye, Soap, and Adhesive [tape]    Review of Systems   Review of Systems  Gastrointestinal:  Positive for abdominal pain. Negative for nausea and vomiting.  Musculoskeletal:  Positive for back pain.    Physical Exam Updated Vital Signs BP (!) 147/83   Pulse 79    Temp 97.6 F (36.4 C) (Oral)   Resp 18   Wt 102.1 kg   SpO2 99%   BMI 38.62 kg/m  Physical Exam Vitals and nursing note reviewed.  Constitutional:      General: She is in acute distress.     Appearance: She is well-developed.     Comments: +writhing in pain  HENT:     Head: Normocephalic and atraumatic.  Eyes:     Conjunctiva/sclera: Conjunctivae normal.  Cardiovascular:     Rate and Rhythm: Normal rate and regular rhythm.     Heart sounds: No murmur heard. Pulmonary:     Effort: Pulmonary effort is normal. No respiratory distress.     Breath sounds: Normal breath sounds.  Abdominal:     Palpations: Abdomen is soft.     Tenderness: There is abdominal tenderness in the right upper quadrant. There is right CVA tenderness.  Musculoskeletal:        General: No swelling.     Cervical back: Neck supple.  Skin:    General: Skin is warm and dry.     Capillary Refill: Capillary refill takes less than 2 seconds.  Neurological:     Mental Status: She is alert.  Psychiatric:        Mood and Affect: Mood normal.     ED Results / Procedures / Treatments   Labs (all labs ordered are listed, but only abnormal results are displayed) Labs Reviewed  COMPREHENSIVE METABOLIC PANEL - Abnormal; Notable for the following components:      Result Value   Glucose, Bld 103 (*)    Calcium 8.3 (*)    All other components within normal limits  CBC - Abnormal; Notable for the following components:   WBC 13.5 (*)    All other components within normal limits  URINALYSIS, ROUTINE W REFLEX MICROSCOPIC - Abnormal; Notable for the following components:   Hgb urine dipstick TRACE (*)    All other components within normal limits  URINALYSIS, MICROSCOPIC (REFLEX) - Abnormal; Notable for the following components:   Bacteria, UA RARE (*)    All other components within normal limits  LIPASE, BLOOD    EKG None  Radiology CT Angio Abd/Pel W and/or Wo Contrast  Result Date: 08/15/2022 CLINICAL  DATA:  Right flank pain. Evaluate for infarction or ischemia. EXAM: CTA ABDOMEN AND PELVIS WITHOUT AND WITH CONTRAST TECHNIQUE: Multidetector CT imaging of the abdomen and pelvis was performed using the standard protocol during bolus administration of intravenous contrast. Multiplanar reconstructed images and MIPs were obtained and reviewed to evaluate the vascular anatomy. RADIATION DOSE REDUCTION: This exam was performed according to the departmental dose-optimization program which includes automated exposure control, adjustment of the mA and/or kV according to patient size and/or use of iterative reconstruction technique. CONTRAST:  OMNIPAQUE IOHEXOL 350 MG/ML SOLN COMPARISON:  CT angiogram chest  08/15/2022. FINDINGS: VASCULAR Aorta: Normal caliber aorta without aneurysm, dissection, vasculitis or significant stenosis. Celiac: Patent without evidence of aneurysm, dissection, vasculitis or significant stenosis. SMA: Patent without evidence of aneurysm, dissection, vasculitis or significant stenosis. Renals: Both renal arteries are patent without evidence of aneurysm, dissection, vasculitis, fibromuscular dysplasia or significant stenosis. IMA: Patent without evidence of aneurysm, dissection, vasculitis or significant stenosis. Inflow: Patent without evidence of aneurysm, dissection, vasculitis or significant stenosis. Proximal Outflow: Bilateral common femoral and visualized portions of the superficial and profunda femoral arteries are patent without evidence of aneurysm, dissection, vasculitis or significant stenosis. Veins: No obvious venous abnormality within the limitations of this arterial phase study. Review of the MIP images confirms the above findings. NON-VASCULAR Lower chest: No acute abnormality. Hepatobiliary: No focal liver abnormality is seen. Status post cholecystectomy. No biliary dilatation. Pancreas: Unremarkable. No pancreatic ductal dilatation or surrounding inflammatory changes. Spleen:  Normal in size without focal abnormality. Adrenals/Urinary Tract: Adrenal glands are unremarkable. Kidneys are normal, without renal calculi, focal lesion, or hydronephrosis. There is normal renal enhancement bilaterally. Bladder is unremarkable. Stomach/Bowel: Stomach is within normal limits. Appendix appears normal. No evidence of bowel wall thickening, distention, or inflammatory changes. Lymphatic: No enlarged lymph nodes are seen. Reproductive: Status post hysterectomy. No adnexal masses. Other: There is a Marella Vanderpol fat containing umbilical hernia. There is no ascites. Musculoskeletal: No acute or significant osseous findings. IMPRESSION: VASCULAR 1. No acute vascular pathology identified. 2. No evidence for renal infarct. NON-VASCULAR 1. No acute process in the abdomen or pelvis. Electronically Signed   By: Darliss Cheney M.D.   On: 08/15/2022 22:43   CT Angio Chest PE W and/or Wo Contrast  Result Date: 08/15/2022 CLINICAL DATA:  Right-sided back pain. EXAM: CT ANGIOGRAPHY CHEST WITH CONTRAST TECHNIQUE: Multidetector CT imaging of the chest was performed using the standard protocol during bolus administration of intravenous contrast. Multiplanar CT image reconstructions and MIPs were obtained to evaluate the vascular anatomy. RADIATION DOSE REDUCTION: This exam was performed according to the departmental dose-optimization program which includes automated exposure control, adjustment of the mA and/or kV according to patient size and/or use of iterative reconstruction technique. CONTRAST:  OMNIPAQUE IOHEXOL 350 MG/ML SOLN COMPARISON:  None Available. FINDINGS: Cardiovascular: The thoracic aorta is normal in appearance. Satisfactory opacification of the pulmonary arteries to the segmental level. No evidence of pulmonary embolism. Normal heart size. No pericardial effusion. Mediastinum/Nodes: No enlarged mediastinal, hilar, or axillary lymph nodes. Thyroid gland, trachea, and esophagus demonstrate no  significant findings. Lungs/Pleura: Lungs are clear. No pleural effusion or pneumothorax. Upper Abdomen: Multiple surgical clips are seen within the gallbladder fossa. Musculoskeletal: No chest wall abnormality. No acute or significant osseous findings. Review of the MIP images confirms the above findings. IMPRESSION: 1. No evidence of pulmonary embolism or acute cardiopulmonary disease. 2. Evidence of prior cholecystectomy. Electronically Signed   By: Aram Candela M.D.   On: 08/15/2022 20:35    Procedures Procedures   Medications Ordered in ED Medications  ketorolac (TORADOL) 15 MG/ML injection 15 mg (15 mg Intravenous Given 08/15/22 1840)  methocarbamol (ROBAXIN) tablet 750 mg (750 mg Oral Given 08/15/22 1841)  fentaNYL (SUBLIMAZE) injection 25 mcg (25 mcg Intravenous Given 08/15/22 1944)  iohexol (OMNIPAQUE) 350 MG/ML injection 100 mL (100 mLs Intravenous Contrast Given 08/15/22 2017)  iohexol (OMNIPAQUE) 350 MG/ML injection 100 mL (100 mLs Intravenous Contrast Given 08/15/22 2217)  oxyCODONE (Oxy IR/ROXICODONE) immediate release tablet 5 mg (5 mg Oral Given 08/15/22 2216)  HYDROmorphone (DILAUDID) injection 0.5 mg (  0.5 mg Intravenous Given 08/15/22 2306)    ED Course/ Medical Decision Making/ A&P Clinical Course as of 08/15/22 2325  Wed Aug 15, 2022  [id number] 42 year old female here with intractable right flank pain it has been going on for a few days.  Had initially gotten better but acutely worse today.  Had an outpatient CT renal that was negative.  Here LFTs normal, CT angio chest is negative.  Still with significant pain.  Will see if we get an angio abdomen, may still need to be admitted for pain control even if we find nothing. [MB]    Clinical Course User Index [MB] Terrilee Files, MD                             Medical Decision Making Patient is a 42 year old female, here for right flank pain has been going fine for the last couple days, it has been getting worse over the last  couple days, and so she is here.  She has had negative CT renal [facility] today, but continues to have pain.  Given her pain we will obtain a CTA of her chest to evaluate for possible PE she does not have a gallbladder, and we will monitor for any LFT elevation to evaluate if we need to do a right upper quadrant ultrasound to show possible choledocho.  Amount and/or Complexity of Data Reviewed Labs: ordered.    Details: Leukocytosis of 13,000, no other acute findings. Radiology: ordered.    Details: CTA chest, abdomen, pelvis all unremarkable Discussion of management or test interpretation with external provider(s): Discussed with patient, she is not feeling any better and I discussed the negative CTA chest, she is requesting another evaluation, I spoke with Dr. Charm Barges, and he has agreed to evaluate patient, greatly appreciate it.  Dr. Bennie Pierini evaluated and recommended a CT abdomen pelvis, to rule out any kind of renal artery dissection.  CTA abdomen pelvis, was negative.  We discussed with patient, we cannot find the etiology of her pain, and she has no transaminitis, just to suggest choledocho.  She states the pain comes and goes and colicky in nature.  We have no acute findings however I offered that I could possibly attempt to admit to hospitalist, for pain management.  We further discussed and she would just like to go home and have pain medication at home, and follow-up with her primary care doctor.  We discussed return precautions and she voiced understanding.  Risk Prescription drug management.   Final Clinical Impression(s) / ED Diagnoses Final diagnoses:  Right flank pain    Rx / DC Orders ED Discharge Orders          Ordered    oxyCODONE-acetaminophen (PERCOCET/ROXICET) 5-325 MG tablet  Every 6 hours PRN        08/15/22 2302    ketorolac (TORADOL) 10 MG tablet  Every 6 hours PRN        08/15/22 2302    cyclobenzaprine (FLEXERIL) 10 MG tablet  2 times daily PRN         08/15/22 2302              Reeda Soohoo Elbert Ewings, PA 08/15/22 2329    Terrilee Files, MD 08/16/22 1007

## 2022-08-15 NOTE — ED Notes (Signed)
Verbal consent given due to signature pad not working.

## 2022-08-15 NOTE — ED Notes (Signed)
Pt given ice pack under her R flank while lying supine. No real improvement in pain yet.

## 2022-08-15 NOTE — ED Notes (Signed)
Pt endorsed severe pain to R flank, affected negatively by moving. Writhing in pain in bed, unchanged with positioning ether.

## 2022-08-15 NOTE — Discharge Instructions (Addendum)
Please follow-up with your primary care doctor, the cause of your abdominal/flank pain is unclear at this time.  Make sure you are taking Tylenol, and the pain medications as prescribed.  Return to the ER if you feel like your condition is getting worse or you have intractable nausea, vomiting, or shortness of breath.

## 2022-08-15 NOTE — ED Triage Notes (Signed)
Pt c/o right side back pain that started on Sunday. PT was seen at [facility] and they were thinking it was a kidney stone but CT scan did not show it. Pt states the pain come and goes.

## 2023-05-15 ENCOUNTER — Emergency Department (HOSPITAL_BASED_OUTPATIENT_CLINIC_OR_DEPARTMENT_OTHER)

## 2023-05-15 ENCOUNTER — Encounter (INDEPENDENT_AMBULATORY_CARE_PROVIDER_SITE_OTHER): Payer: Self-pay

## 2023-05-15 ENCOUNTER — Emergency Department (HOSPITAL_BASED_OUTPATIENT_CLINIC_OR_DEPARTMENT_OTHER)
Admission: EM | Admit: 2023-05-15 | Discharge: 2023-05-15 | Disposition: A | Discharge status: Home / Self Care | Attending: Emergency Medicine | Admitting: Emergency Medicine

## 2023-05-15 ENCOUNTER — Other Ambulatory Visit: Payer: Self-pay

## 2023-05-15 ENCOUNTER — Encounter (HOSPITAL_BASED_OUTPATIENT_CLINIC_OR_DEPARTMENT_OTHER): Payer: Self-pay | Admitting: Emergency Medicine

## 2023-05-15 DIAGNOSIS — D72829 Elevated white blood cell count, unspecified: Secondary | ICD-10-CM | POA: Insufficient documentation

## 2023-05-15 DIAGNOSIS — R0602 Shortness of breath: Secondary | ICD-10-CM | POA: Insufficient documentation

## 2023-05-15 DIAGNOSIS — R062 Wheezing: Secondary | ICD-10-CM | POA: Insufficient documentation

## 2023-05-15 DIAGNOSIS — R059 Cough, unspecified: Secondary | ICD-10-CM | POA: Diagnosis not present

## 2023-05-15 LAB — I-STAT VENOUS BLOOD GAS, ED
Acid-base deficit: 1 mmol/L (ref 0.0–2.0)
Bicarbonate: 21.9 mmol/L (ref 20.0–28.0)
Calcium, Ion: 1.11 mmol/L — ABNORMAL LOW (ref 1.15–1.40)
HCT: 41 % (ref 36.0–46.0)
Hemoglobin: 13.9 g/dL (ref 12.0–15.0)
O2 Saturation: 92 %
Potassium: 3.7 mmol/L (ref 3.5–5.1)
Sodium: 139 mmol/L (ref 135–145)
TCO2: 23 mmol/L (ref 22–32)
pCO2, Ven: 30 mmHg — ABNORMAL LOW (ref 44–60)
pH, Ven: 7.471 — ABNORMAL HIGH (ref 7.25–7.43)
pO2, Ven: 58 mmHg — ABNORMAL HIGH (ref 32–45)

## 2023-05-15 LAB — RESP PANEL BY RT-PCR (RSV, FLU A&B, COVID)  RVPGX2
Influenza A by PCR: NEGATIVE
Influenza B by PCR: NEGATIVE
Resp Syncytial Virus by PCR: NEGATIVE
SARS Coronavirus 2 by RT PCR: NEGATIVE

## 2023-05-15 LAB — CBC WITH DIFFERENTIAL/PLATELET
Abs Immature Granulocytes: 0.12 10*3/uL — ABNORMAL HIGH (ref 0.00–0.07)
Basophils Absolute: 0 10*3/uL (ref 0.0–0.1)
Basophils Relative: 0 %
Eosinophils Absolute: 0 10*3/uL (ref 0.0–0.5)
Eosinophils Relative: 0 %
HCT: 39.7 % (ref 36.0–46.0)
Hemoglobin: 13.5 g/dL (ref 12.0–15.0)
Immature Granulocytes: 1 %
Lymphocytes Relative: 14 %
Lymphs Abs: 1.7 10*3/uL (ref 0.7–4.0)
MCH: 30.3 pg (ref 26.0–34.0)
MCHC: 34 g/dL (ref 30.0–36.0)
MCV: 89.2 fL (ref 80.0–100.0)
Monocytes Absolute: 0.4 10*3/uL (ref 0.1–1.0)
Monocytes Relative: 3 %
Neutro Abs: 10.1 10*3/uL — ABNORMAL HIGH (ref 1.7–7.7)
Neutrophils Relative %: 82 %
Platelets: 365 10*3/uL (ref 150–400)
RBC: 4.45 MIL/uL (ref 3.87–5.11)
RDW: 13.2 % (ref 11.5–15.5)
WBC: 12.3 10*3/uL — ABNORMAL HIGH (ref 4.0–10.5)
nRBC: 0 % (ref 0.0–0.2)

## 2023-05-15 LAB — COMPREHENSIVE METABOLIC PANEL
ALT: 45 U/L — ABNORMAL HIGH (ref 0–44)
AST: 31 U/L (ref 15–41)
Albumin: 4.1 g/dL (ref 3.5–5.0)
Alkaline Phosphatase: 67 U/L (ref 38–126)
Anion gap: 13 (ref 5–15)
BUN: 15 mg/dL (ref 6–20)
CO2: 20 mmol/L — ABNORMAL LOW (ref 22–32)
Calcium: 9.1 mg/dL (ref 8.9–10.3)
Chloride: 103 mmol/L (ref 98–111)
Creatinine, Ser: 0.76 mg/dL (ref 0.44–1.00)
GFR, Estimated: >60 mL/min (ref >60)
Glucose, Bld: 120 mg/dL — ABNORMAL HIGH (ref 70–99)
Potassium: 4 mmol/L (ref 3.5–5.1)
Sodium: 136 mmol/L (ref 135–145)
Total Bilirubin: 0.7 mg/dL (ref 0.0–1.2)
Total Protein: 8 g/dL (ref 6.5–8.1)

## 2023-05-15 MED ORDER — DOXYCYCLINE HYCLATE 100 MG PO CAPS: 100.0000 mg | ORAL_CAPSULE | Freq: Two times a day (BID) | ORAL | 0 refills | Status: AC

## 2023-05-15 MED ORDER — ALBUTEROL SULFATE (2.5 MG/3ML) 0.083% IN NEBU
5.0000 mg | INHALATION_SOLUTION | Freq: Once | RESPIRATORY_TRACT | Status: AC
  Administered 2023-05-15: 5 mg via RESPIRATORY_TRACT
  Filled 2023-05-15: qty 6

## 2023-05-15 MED ORDER — IPRATROPIUM BROMIDE 0.02 % IN SOLN
0.5000 mg | Freq: Once | RESPIRATORY_TRACT | Status: AC
  Administered 2023-05-15: 0.5 mg via RESPIRATORY_TRACT
  Filled 2023-05-15: qty 2.5

## 2023-05-15 MED ORDER — METHYLPREDNISOLONE SODIUM SUCC 125 MG IJ SOLR
125.0000 mg | Freq: Once | INTRAMUSCULAR | Status: AC
  Administered 2023-05-15: 125 mg via INTRAVENOUS
  Filled 2023-05-15: qty 2

## 2023-05-15 MED ORDER — IPRATROPIUM-ALBUTEROL 0.5-2.5 (3) MG/3ML IN SOLN
3.0000 mL | Freq: Once | RESPIRATORY_TRACT | Status: AC
  Administered 2023-05-15: 3 mL via RESPIRATORY_TRACT
  Filled 2023-05-15: qty 3

## 2023-05-15 MED ORDER — ALBUTEROL SULFATE (5 MG/ML) 0.5% IN NEBU: 2.5000 mg | INHALATION_SOLUTION | Freq: Four times a day (QID) | RESPIRATORY_TRACT | 12 refills | Status: AC | PRN

## 2023-05-15 NOTE — ED Notes (Signed)
 Pt. Went to restroom with her urine cup and states she forgot to urinate in the cup

## 2023-05-15 NOTE — ED Notes (Signed)
 RT Note: Patient was wanting to be re checked. She feels her chest is getting very tight and it is hard to breath. She has HX Asthma. She is ordered a 5ml Albuterol and 5 Atrovent.

## 2023-05-15 NOTE — Discharge Instructions (Addendum)
 You were seen today for shortness of breath.  This is most likely due to a viral upper respiratory infection however with your concomitant alpha 1 antitrypsin, I am prescribing you an antibiotic due to you having mucus changes and worsening symptoms.  I have also given you a dose of steroids here to help with continued shortness of breath.  You were noted to have a mildly elevated white count, which is also another reason why I am sending you home with antibiotic.  I have also sent in a nebulizer.  Recommend that you continue to follow-up with your PCP for further evaluation and possible pulmonology referral for alpha-1 antitrypsin.  Return to ED if you begin to have any new or worsening symptoms including worsening shortness of breath despite medications, lethargy, fatigue, chest pain, fever, dizziness, vision changes.

## 2023-05-15 NOTE — ED Notes (Signed)
 RT Note: VBG completed

## 2023-05-15 NOTE — ED Provider Notes (Signed)
 [facility] Provider Note   CSN: [id number] Arrival date & time: 05/15/23  1418     History  Chief Complaint  Patient presents with   Shortness of Breath   Cough    April Madden is a 43 y.o. female.   Shortness of Breath Associated symptoms: cough   Cough Associated symptoms: shortness of breath   Patient is a 43 year old female presenting today with a 4-day history of cough, congestion, shortness of breath that was acutely worse today noted to have also been worse with exertion.  Previous medical history of alpha 1 antitrypsin, IBS, fibromyalgia, migraines, bipolar border.  States that she has been using her albuterol inhaler more frequently with minimal relief today.  Also reports a mucus color change.  Has taken 3 days of prednisone without relief.  Denies any sick contacts.  Denies fever, vision changes, abdominal pain, nausea, vomiting, diarrhea, dysuria, hematochezia, melena, lower leg swelling.     Home Medications Prior to Admission medications   Medication Sig Start Date End Date Taking? Authorizing Provider  albuterol (PROVENTIL) (5 MG/ML) 0.5% nebulizer solution Take 0.5 mLs (2.5 mg total) by nebulization every 6 (six) hours as needed for wheezing or shortness of breath. 05/15/23  Yes Lunette Stands, PA-C  doxycycline (VIBRAMYCIN) 100 MG capsule Take 1 capsule (100 mg total) by mouth 2 (two) times daily for 5 days. 05/15/23 05/20/23 Yes Lunette Stands, PA-C  albuterol (VENTOLIN HFA) 108 (90 Base) MCG/ACT inhaler Inhale 1-2 puffs into the lungs every 6 (six) hours as needed for wheezing or shortness of breath. 11/11/19   Farrel Gordon, PA-C  brompheniramine-pseudoephedrine-DM 30-2-10 MG/5ML syrup Take 3 mLs by mouth See admin instructions. 10 times daily as needed 11/07/19   [provider]  buPROPion (WELLBUTRIN XL) 300 MG 24 hr tablet Take 300 mg by mouth daily.    [provider]  busPIRone (BUSPAR) 10  MG tablet Take 10 mg by mouth 2 (two) times daily.    [provider]  cyclobenzaprine (FLEXERIL) 10 MG tablet Take 1 tablet (10 mg total) by mouth 2 (two) times daily as needed for muscle spasms. 08/15/22   Small, Brooke L, PA  dimenhyDRINATE (DRAMAMINE) 50 MG tablet Take 1 tablet (50 mg total) by mouth every 8 (eight) hours as needed for dizziness. 03/28/18   Shaune Pollack, MD  gabapentin (NEURONTIN) 300 MG capsule Take 300 mg by mouth 3 (three) times daily.    [provider]  ketorolac (TORADOL) 10 MG tablet Take 1 tablet (10 mg total) by mouth every 6 (six) hours as needed. 08/15/22   Small, Brooke L, PA  lamoTRIgine (LAMICTAL) 100 MG tablet Take 100 mg by mouth daily. 09/05/19   [provider]  meloxicam (MOBIC) 7.5 MG tablet Take 1 tablet (7.5 mg total) by mouth daily. 03/20/18   Palumbo, April, MD  montelukast (SINGULAIR) 10 MG tablet Take 10 mg by mouth daily. 11/07/19   [provider]  ondansetron (ZOFRAN ODT) 4 MG disintegrating tablet Take 1 tablet (4 mg total) by mouth every 8 (eight) hours as needed for nausea or vomiting. 03/28/18   Shaune Pollack, MD  oxyCODONE-acetaminophen (PERCOCET/ROXICET) 5-325 MG tablet Take 1 tablet by mouth every 6 (six) hours as needed for severe pain. 08/15/22   Small, Brooke L, PA  predniSONE (STERAPRED UNI-PAK 21 TAB) 10 MG (21) TBPK tablet Take 1-6 tablets by mouth See admin instructions. 6 day taper 11/07/19   [provider]  Semaglutide-Weight Management (WEGOVY) 0.25 MG/0.5ML SOAJ Inject 0.5 mLs (0.25 mg total) into the skin every 7 days for 28 days. Then call PCP for refill at 0.5mg  dose. 04/26/22         Allergies    Butorphanol, Eletriptan, Morphine and codeine, Pantoprazole, Red dye #17 (toney red), Red dye #40 (allura red), Sumatriptan, Other, Soap, and Adhesive [tape]    Review of Systems   Review of Systems  Respiratory:  Positive for cough and shortness of breath.   All other systems reviewed and are  negative.   Physical Exam Updated Vital Signs BP (!) 143/100   Pulse 90   Temp 98 F (36.7 C) (Oral)   Resp 16   Ht 5\' 3"  (1.6 m)   Wt 108.9 kg   SpO2 97%   BMI 42.51 kg/m  Physical Exam Vitals and nursing note reviewed.  Constitutional:      General: She is not in acute distress.    Appearance: Normal appearance. She is ill-appearing.  HENT:     Head: Normocephalic and atraumatic.     Mouth/Throat:     Mouth: Mucous membranes are moist.     Pharynx: Oropharynx is clear. No pharyngeal swelling or oropharyngeal exudate.  Eyes:     Extraocular Movements: Extraocular movements intact.     Conjunctiva/sclera: Conjunctivae normal.  Neck:     Vascular: No JVD.     Trachea: No tracheal deviation.  Cardiovascular:     Rate and Rhythm: Normal rate and regular rhythm.     Pulses: Normal pulses.     Heart sounds: Normal heart sounds. No murmur heard.    No friction rub. No gallop.  Pulmonary:     Effort: Pulmonary effort is normal. Tachypnea present. No respiratory distress.     Breath sounds: Examination of the right-upper field reveals wheezing. Examination of the left-upper field reveals wheezing. Examination of the right-middle field reveals wheezing and rhonchi. Examination of the left-middle field reveals wheezing and rhonchi. Examination of the right-lower field reveals wheezing and rhonchi. Examination of the left-lower field reveals wheezing and rhonchi. Wheezing and rhonchi present. No decreased breath sounds or rales.  Chest:     Chest wall: No deformity or tenderness.  Abdominal:     General: Abdomen is flat.     Palpations: Abdomen is soft.     Tenderness: There is no abdominal tenderness. There is no guarding or rebound.  Musculoskeletal:     Cervical back: Normal range of motion.     Right lower leg: No tenderness. No edema.     Left lower leg: No tenderness. No edema.  Skin:    General: Skin is warm and dry.     Coloration: Skin is not pale.     Findings: No  ecchymosis or erythema.  Neurological:     General: No focal deficit present.     Mental Status: She is alert. Mental status is at baseline.  Psychiatric:        Mood and Affect: Mood normal.     ED Results / Procedures / Treatments   Labs (all labs ordered are listed, but only abnormal results are displayed) Labs Reviewed  CBC WITH DIFFERENTIAL/PLATELET - Abnormal; Notable for the following components:      Result Value   WBC 12.3 (*)    Neutro Abs 10.1 (*)    Abs Immature Granulocytes 0.12 (*)    All other components within normal limits  COMPREHENSIVE METABOLIC PANEL - Abnormal; Notable for the  following components:   CO2 20 (*)    Glucose, Bld 120 (*)    ALT 45 (*)    All other components within normal limits  I-STAT VENOUS BLOOD GAS, ED - Abnormal; Notable for the following components:   pH, Ven 7.471 (*)    pCO2, Ven 30.0 (*)    pO2, Ven 58 (*)    Calcium, Ion 1.11 (*)    All other components within normal limits  RESP PANEL BY RT-PCR (RSV, FLU A&B, COVID)  [id number]  BLOOD GAS, VENOUS    EKG None  Radiology DG Chest 2 View Result Date: 05/15/2023 CLINICAL DATA:  Cough and shortness of breath.  History of COPD. EXAM: CHEST - 2 VIEW COMPARISON:  Radiograph 02/18/2023.  CT 08/15/2022 FINDINGS: The cardiomediastinal contours are normal. Bronchial thickening. Pulmonary vasculature is normal. No consolidation, pleural effusion, or pneumothorax. No acute osseous abnormalities are seen. IMPRESSION: Bronchial thickening, can be seen with bronchitis or asthma. Electronically Signed   By: Narda Rutherford M.D.   On: 05/15/2023 16:31    Procedures Procedures    Medications Ordered in ED Medications  ipratropium-albuterol (DUONEB) 0.5-2.5 (3) MG/3ML nebulizer solution 3 mL (3 mLs Nebulization Given 05/15/23 1439)  albuterol (PROVENTIL) (2.5 MG/3ML) 0.083% nebulizer solution 5 mg (5 mg Nebulization Given 05/15/23 1725)  ipratropium (ATROVENT) nebulizer solution 0.5 mg (0.5 mg  Nebulization Given 05/15/23 1725)  methylPREDNISolone sodium succinate (SOLU-MEDROL) 125 mg/2 mL injection 125 mg (125 mg Intravenous Given 05/15/23 2040)    ED Course/ Medical Decision Making/ A&P                                Medical Decision Making Amount and/or Complexity of Data Reviewed Labs: ordered. Radiology: ordered.  Risk Prescription drug management.   Patient is a 43 year old female presenting today with a 4-day history of cough, congestion, shortness of breath that was acutely worse today noted to have also been worse with exertion.  Previous medical history of alpha 1 antitrypsin deficiency, IBS, fibromyalgia, migraines, bipolar border.  States that she has been using her albuterol inhaler more frequently with minimal relief today.  Also reports a mucus color change.  Has taken 3 days of prednisone without relief.  Denies any sick contacts.  On exam, patient is noted to be tachypneic with wheezing and rhonchi noted bilaterally.  Afebrile, no acute distress with able to answer questions appropriately and alert oriented x 4.  Exam was otherwise unremarkable. Labs were noted to have a pH of 7.471 with no anion gap and a leukocytosis of 12.3. Provided DuoNebs x 3 as well as methylprednisone.  On reevaluation, patient had noted that symptoms had greatly improved and was able to ambulate without difficulty/shortness of breath.  Wheezing had also greatly improved.  She states that she feels good to go home now.  However due to her mucous changes, will send home with nebulizer as well as doxycycline, considering her alpha-1 antitrypsin deficiency.  Consulted case with attending and agreed with plan.  Also recommend that she follow-up with PCP for further evaluation and monitoring to ensure proper treatment.  Recommend that she continue her prednisone course and complete it.  I believe this patient is safe to be discharged at this time.  Patient was agreement understanding of plan.  Provided  strict return to ED precautions  Differential diagnoses prior to evaluation: The emergent differential diagnosis includes, but is not limited to,   Asthma  exacerbation, URI, pneumonia, bronchitis, pneumothorax, ACS, PE, COPD exacerbation, cardiac tamponade. This is not an exhaustive differential.   Past Medical History / Co-morbidities / Social History: Fibromyalgia, IBS, eczema, bipolar, alpha 1 antitrypsin phenotype, migraines, hemorrhoid  Additional history: Chart reviewed. Pertinent results include:  Seen today by urgent care and was told to come to ED for increased shortness of breath and decreased O2 sats.  Lab Tests/Imaging studies: I personally interpreted labs/imaging and the pertinent results include:   CBC notes an elevated leukocytosis of 12.3 which seems improved from previous, CMP does note a decrease CO2 of 20  I-STAT VBG does note a pH of 7.47 and a CO2 of 30 Respiratory panel unremarkable  X-ray does show bronchial thickening I agree with the radiologist interpretation.  Cardiac monitoring: EKG obtained and interpreted by myself and attending physician which shows: Sinus rhythm   Medications: I ordered medication including DuoNebs, methylprednisone.  I have reviewed the patients home medicines and have made adjustments as needed.    Disposition: After consideration of the diagnostic results and the patients response to treatment, I feel that patient benefit from discharge and treatment as above.   emergency department workup does not suggest an emergent condition requiring admission or immediate intervention beyond what has been performed at this time. The plan is: Nebulizer with albuterol, continue prednisone course, doxycycline, follow-up with PCP, return for new or worsening symptoms. The patient is safe for discharge and has been instructed to return immediately for worsening symptoms, change in symptoms or any other concerns.  Final Clinical Impression(s) / ED  Diagnoses Final diagnoses:  Shortness of breath    Rx / DC Orders ED Discharge Orders          Ordered    doxycycline (VIBRAMYCIN) 100 MG capsule  2 times daily        05/15/23 2018    For home use only DME Nebulizer machine        05/15/23 2113    albuterol (PROVENTIL) (5 MG/ML) 0.5% nebulizer solution  Every 6 hours PRN        05/15/23 2113              Lunette Stands, PA-C 05/15/23 2203    Franne Forts, DO 05/16/23 0010

## 2023-05-15 NOTE — ED Triage Notes (Signed)
 Cough and shob since Saturday. Seen at [facility] and dx with PNA via auscultation (no chest xr performed). Sent here for further eval.

## 2024-03-31 ENCOUNTER — Other Ambulatory Visit (HOSPITAL_COMMUNITY): Payer: Self-pay
